# Patient Record
Sex: Male | Born: 1980 | Race: White | Hispanic: No | Marital: Married | Smoking: Never smoker
Health system: Southern US, Community
[De-identification: ages and names within clinical notes are randomized; demographics above are authoritative.]

## PROBLEM LIST (undated history)

## (undated) DIAGNOSIS — K219 Gastro-esophageal reflux disease without esophagitis: Secondary | ICD-10-CM

## (undated) HISTORY — PX: HERNIA REPAIR: SHX51

## (undated) HISTORY — PX: OTHER SURGICAL HISTORY: SHX169

---

## 2005-11-07 ENCOUNTER — Emergency Department: Payer: Self-pay | Admitting: Emergency Medicine

## 2006-01-01 ENCOUNTER — Emergency Department: Payer: Self-pay | Admitting: Emergency Medicine

## 2006-01-01 ENCOUNTER — Other Ambulatory Visit: Payer: Self-pay

## 2009-06-06 ENCOUNTER — Emergency Department: Payer: Self-pay | Admitting: Emergency Medicine

## 2009-07-15 ENCOUNTER — Emergency Department: Payer: Self-pay | Admitting: Emergency Medicine

## 2009-08-12 ENCOUNTER — Emergency Department: Payer: Self-pay | Admitting: Emergency Medicine

## 2010-10-16 ENCOUNTER — Ambulatory Visit: Payer: Self-pay | Admitting: Family Medicine

## 2012-07-06 ENCOUNTER — Ambulatory Visit: Payer: Self-pay | Admitting: Family Medicine

## 2012-07-06 LAB — URINALYSIS, COMPLETE
Bilirubin,UR: NEGATIVE
Leukocyte Esterase: NEGATIVE
Nitrite: NEGATIVE
Protein: NEGATIVE
Specific Gravity: >=1.03 (ref 1.003–1.030)

## 2012-07-08 LAB — URINE CULTURE

## 2013-03-28 ENCOUNTER — Emergency Department: Payer: Self-pay | Admitting: Emergency Medicine

## 2014-02-15 ENCOUNTER — Emergency Department: Payer: Self-pay | Admitting: Emergency Medicine

## 2014-10-21 IMAGING — CT CT LUMBAR SPINE WITHOUT CONTRAST
3 of 9 series · 14 of 33 positions shown, 17 images · non-contrast
Comparison: None.

CLINICAL DATA: Back pain, sudden onset.  Initial encounter.

EXAM:
CT LUMBAR SPINE WITHOUT CONTRAST
TECHNIQUE: Multidetector CT imaging of the lumbar spine was performed without
intravenous contrast administration. Multiplanar CT image
reconstructions were also generated.

[Series 3: l spine soft · axial · 0.41mm/px · z∈[-428,-242]mm · 6 of 131 slices shown, 8 images]
[im 19/131  soft-tissue]
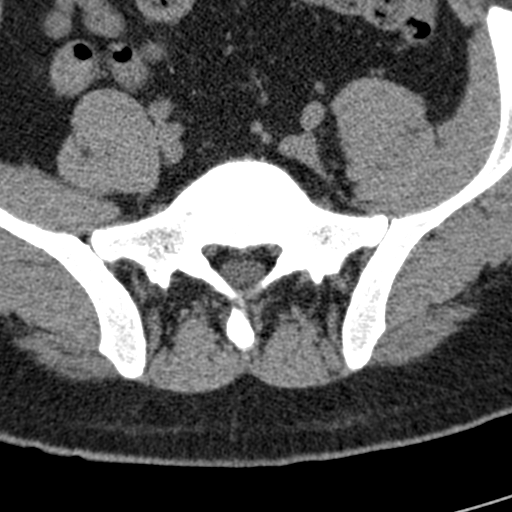
[im 19/131  bone]
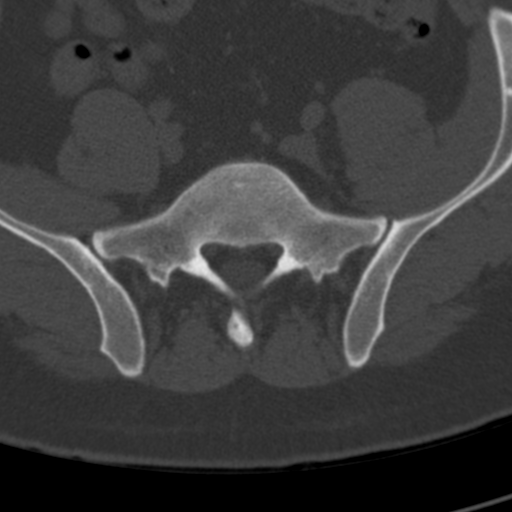
[im 38/131  bone]
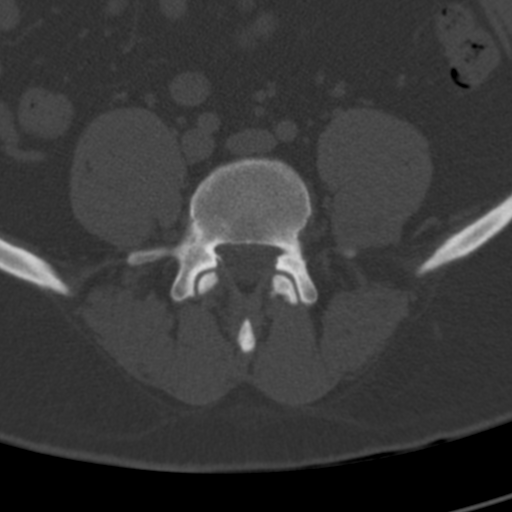
[im 56/131  bone]
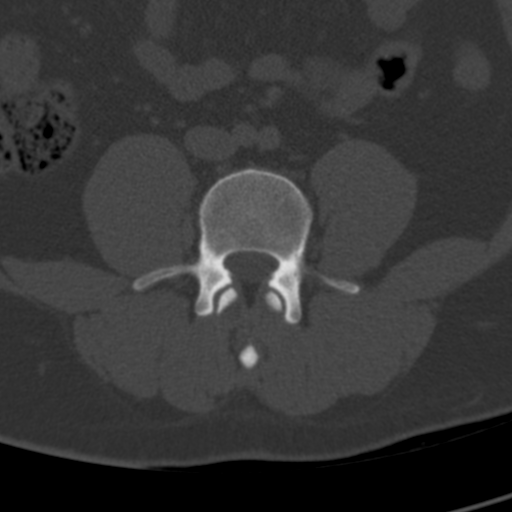
[im 75/131  bone]
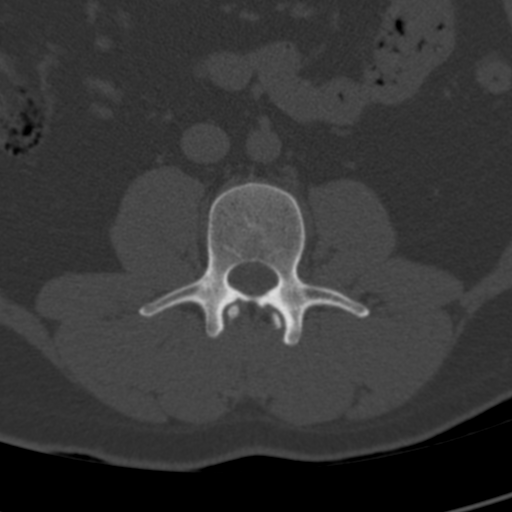
[im 93/131  soft-tissue]
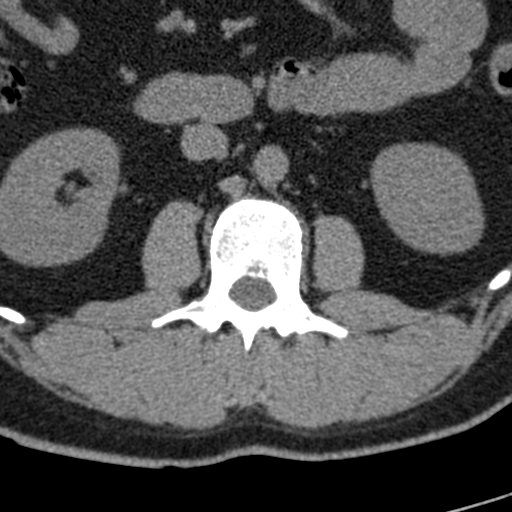
[im 93/131  bone]
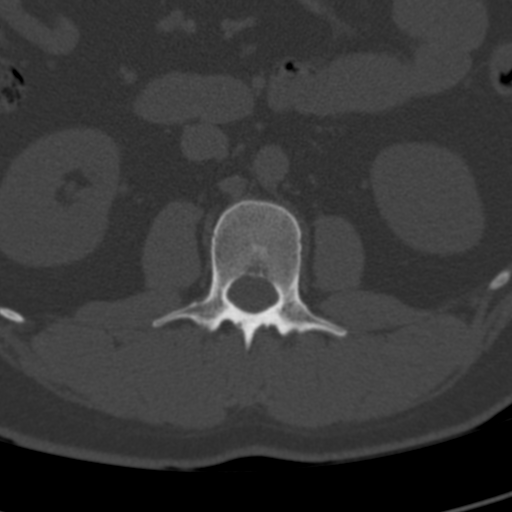
[im 112/131  bone]
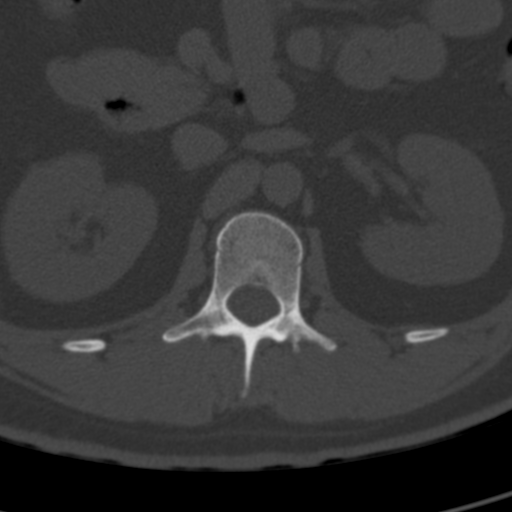

[Series 5: sagittal bone · sagittal · 0.38mm/px · 5 of 67 slices shown, 6 images]
[im 23/67  bone]
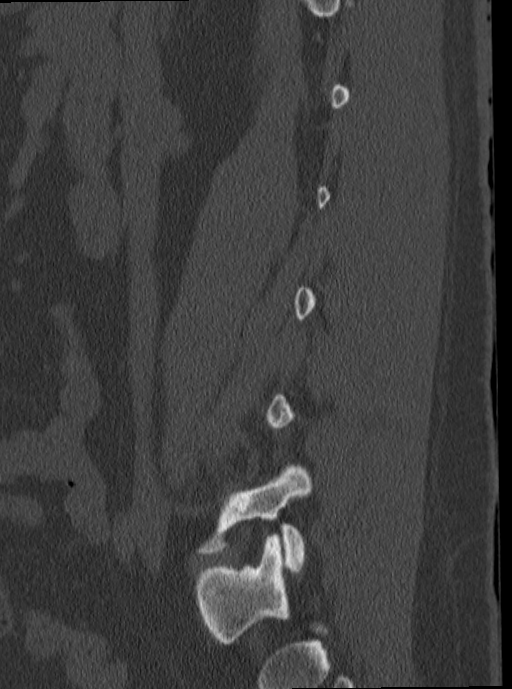
[im 28/67  bone]
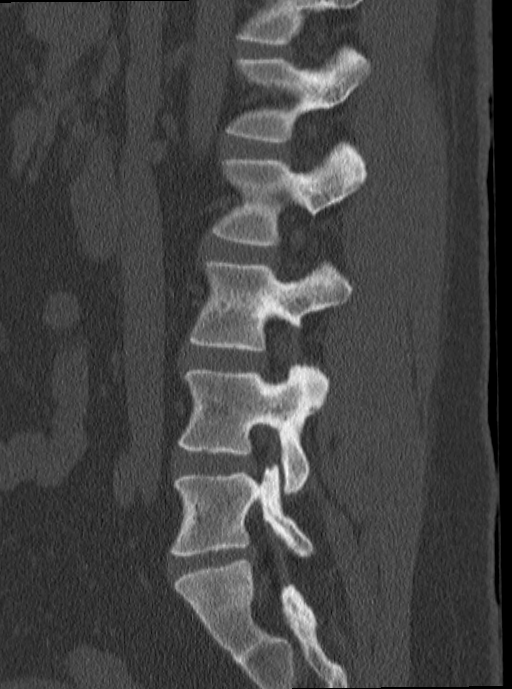
[im 34/67  soft-tissue]
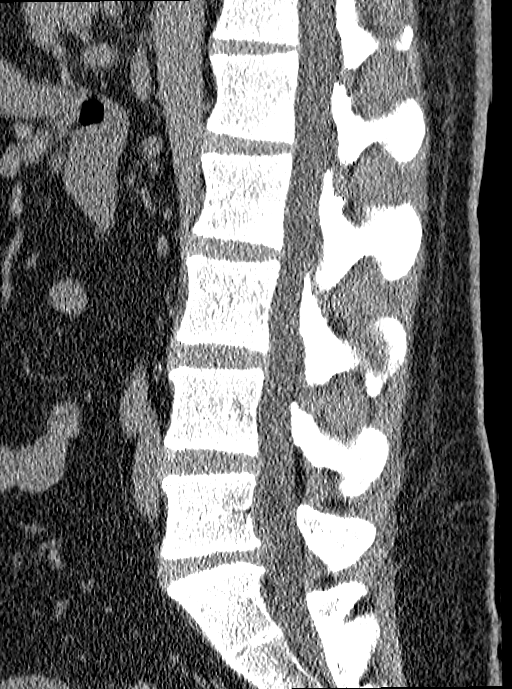
[im 34/67  bone]
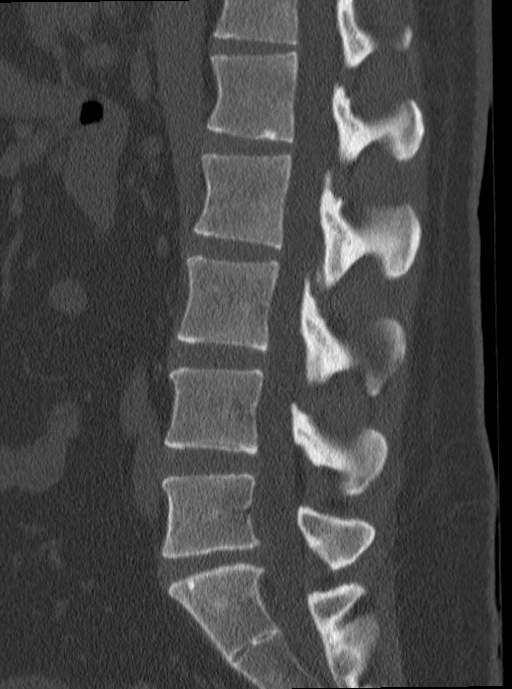
[im 39/67  bone]
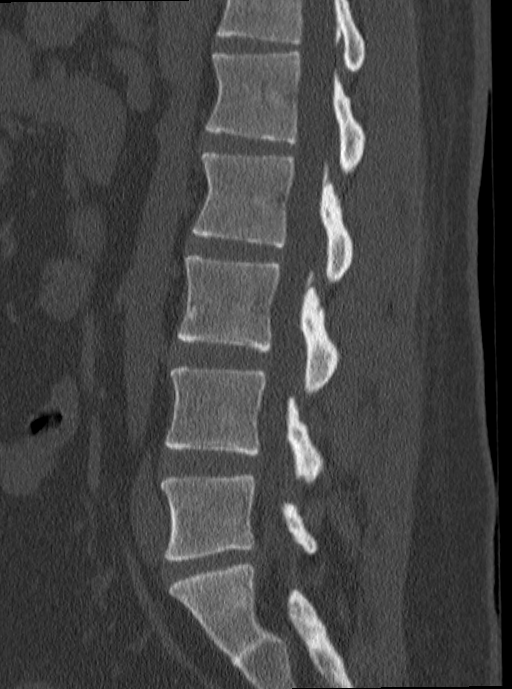
[im 45/67  bone]
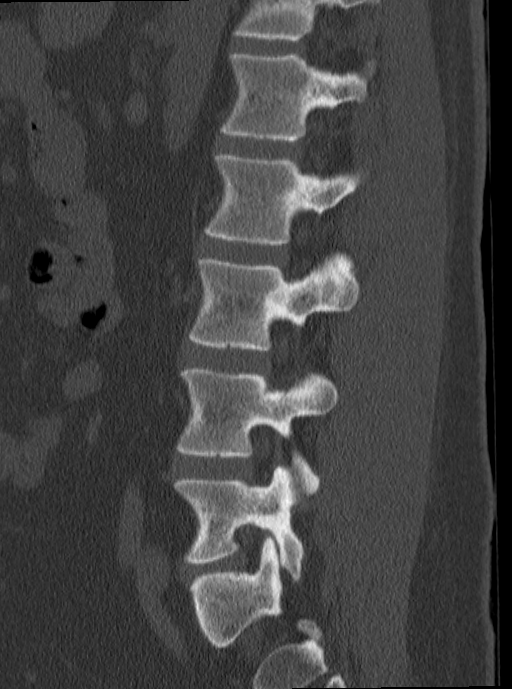

[Series 6: coronal bone · coronal · 0.37mm/px · 3 of 59 slices shown]
[im 12/59  bone]
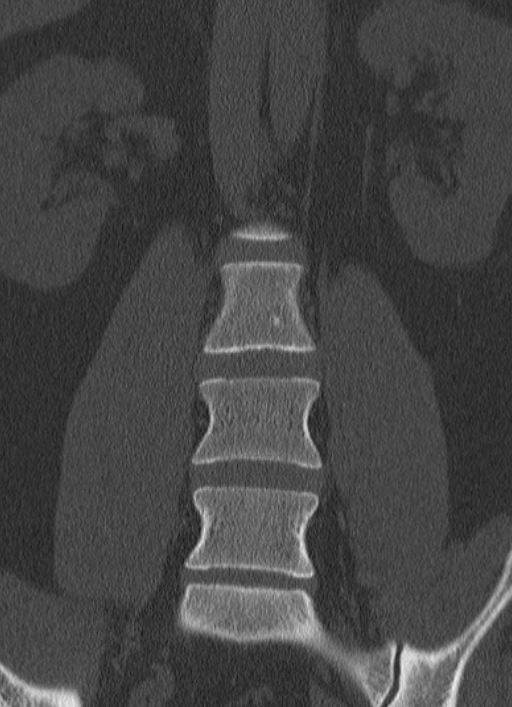
[im 24/59  bone]
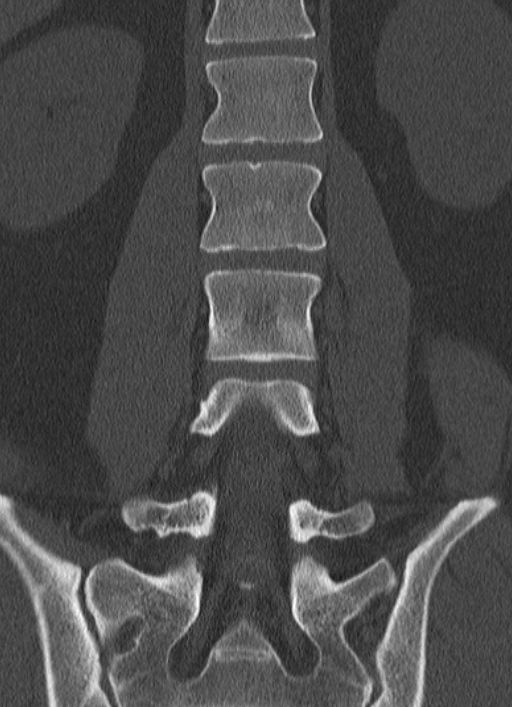
[im 35/59  bone]
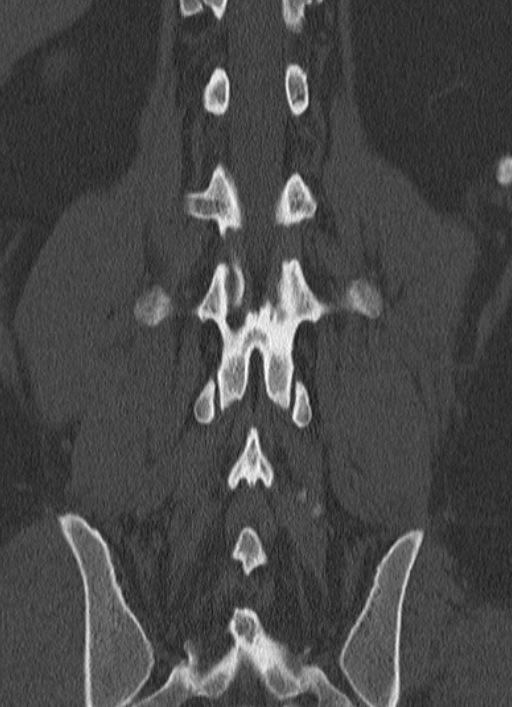

[14 of 33 positions shown; findings below may reference images not displayed]

FINDINGS: There is no acute findings such as fracture, subluxation, or bony
erosion. The lumbar spine is straight, which may be positional.

There is mild disc narrowing at L5-S1, with circumferential disc
bulging and small to moderate central disc herniation. There is no
evidence for high-grade canal or foraminal stenosis.
IMPRESSION: 1. No acute osseous findings.
2. Mild L5-S1 degenerative disc disease with central disc
herniation. No significant canal or foraminal stenosis.

## 2019-02-15 ENCOUNTER — Other Ambulatory Visit: Payer: Self-pay

## 2019-02-15 ENCOUNTER — Ambulatory Visit
Admission: EM | Admit: 2019-02-15 | Discharge: 2019-02-15 | Disposition: A | Discharge status: Home / Self Care | Payer: Self-pay | Attending: Family Medicine | Admitting: Family Medicine

## 2019-02-15 DIAGNOSIS — H1089 Other conjunctivitis: Secondary | ICD-10-CM

## 2019-02-15 MED ORDER — MOXIFLOXACIN HCL 0.5 % OP SOLN: 1.0000 [drp] | Freq: Three times a day (TID) | OPHTHALMIC | 0 refills | Status: AC

## 2019-02-15 NOTE — ED Provider Notes (Signed)
[facility]    CSN: [id number] Arrival date & time: 02/15/19  Francis Sanchez      History   Chief Complaint Chief Complaint  Patient presents with  . Eye Pain    left    HPI Francis Sanchez is a 38 y.o. male.   38 yo male with a c/o left eye irritation, redness, and drainage for the past 6 days. Denies any injuries or foreign body sensation. Does not use contact lenses.    Eye Pain    History reviewed. No pertinent past medical history.  There are no active problems to display for this patient.   Past Surgical History:  Procedure Laterality Date  . HERNIA REPAIR         Home Medications    Prior to Admission medications   Medication Sig Start Date End Date Taking? Authorizing Provider  moxifloxacin (VIGAMOX) 0.5 % ophthalmic solution Place 1 drop into the left eye 3 (three) times daily for 5 days. 02/15/19 02/20/19  Norval Gable, MD    Family History Family History  Problem Relation Age of Onset  . Hypertension Mother   . Hyperlipidemia Mother   . Allergic rhinitis Mother   . Hypertension Father   . Hyperlipidemia Father   . CVA Father   . Diabetes Father   . Heart attack Father     Social History Social History   Tobacco Use  . Smoking status: Never Smoker  . Smokeless tobacco: Never Used  Substance Use Topics  . Alcohol use: Never    Frequency: Never  . Drug use: Yes    Types: Marijuana     Allergies   Patient has no known allergies.   Review of Systems Review of Systems  Eyes: Positive for pain.     Physical Exam Triage Vital Signs ED Triage Vitals  Enc Vitals Group     BP 02/15/19 1724 (!) 160/96     Pulse Rate 02/15/19 1724 86     Resp 02/15/19 1724 17     Temp 02/15/19 1724 98.5 F (36.9 C)     Temp Source 02/15/19 1724 Oral     SpO2 02/15/19 1724 99 %     Weight 02/15/19 1721 240 lb (108.9 kg)     Height 02/15/19 1721 5\' 11"  (1.803 m)     Head Circumference --      Peak Flow --      Pain Score 02/15/19  1721 3     Pain Loc --      Pain Edu? --      Excl. in [place]? --    No data found.  Updated Vital Signs BP (!) 160/96 (BP Location: Left Arm)   Pulse 86   Temp 98.5 F (36.9 C) (Oral)   Resp 17   Ht 5\' 11"  (1.803 m)   Wt 108.9 kg   SpO2 99%   BMI 33.47 kg/m   Visual Acuity Right Eye Distance: 20/25 Left Eye Distance: 20/15 Bilateral Distance: 20/13(corrected)  Right Eye Near:   Left Eye Near:    Bilateral Near:     Physical Exam Vitals signs and nursing note reviewed.  Constitutional:      General: He is not in acute distress.    Appearance: He is not toxic-appearing or diaphoretic.  Eyes:     General: Lids are everted, no foreign bodies appreciated.        Left eye: Discharge present.    Extraocular Movements: Extraocular movements intact.  Conjunctiva/sclera:     Left eye: Left conjunctiva is injected.     Pupils: Pupils are equal, round, and reactive to light.  Neurological:     Mental Status: He is alert.      UC Treatments / Results  Labs (all labs ordered are listed, but only abnormal results are displayed) Labs Reviewed - No data to display  EKG   Radiology No results found.  Procedures Procedures (including critical care time)  Medications Ordered in UC Medications - No data to display  Initial Impression / Assessment and Plan / UC Course  I have reviewed the triage vital signs and the nursing notes.  Pertinent labs & imaging results that were available during my care of the patient were reviewed by me and considered in my medical decision making (see chart for details).      Final Clinical Impressions(s) / UC Diagnoses   Final diagnoses:  Other conjunctivitis of left eye    ED Prescriptions    Medication Sig Dispense Auth. Provider   moxifloxacin (VIGAMOX) 0.5 % ophthalmic solution Place 1 drop into the left eye 3 (three) times daily for 5 days. 3 mL Payton Mccallum, MD      1. diagnosis reviewed with patient 2. rx as per  orders above; reviewed possible side effects, interactions, risks and benefits  3. Follow-up prn if symptoms worsen or don't improve   PDMP not reviewed this encounter.   Payton Mccallum, MD 02/15/19 1935

## 2019-02-15 NOTE — ED Triage Notes (Signed)
Patient complains of left eye irritation x 1 week that is worse in the morning.

## 2019-05-16 ENCOUNTER — Emergency Department: Payer: 59

## 2019-05-16 ENCOUNTER — Other Ambulatory Visit: Payer: Self-pay

## 2019-05-16 ENCOUNTER — Emergency Department
Admission: EM | Admit: 2019-05-16 | Discharge: 2019-05-16 | Disposition: A | Discharge status: Home / Self Care | Payer: 59 | Attending: Emergency Medicine | Admitting: Emergency Medicine

## 2019-05-16 ENCOUNTER — Encounter: Payer: Self-pay | Admitting: Emergency Medicine

## 2019-05-16 DIAGNOSIS — U071 COVID-19: Secondary | ICD-10-CM | POA: Insufficient documentation

## 2019-05-16 DIAGNOSIS — B349 Viral infection, unspecified: Secondary | ICD-10-CM

## 2019-05-16 DIAGNOSIS — R05 Cough: Secondary | ICD-10-CM | POA: Diagnosis present

## 2019-05-16 LAB — SARS CORONAVIRUS 2 (TAT 6-24 HRS): SARS Coronavirus 2: POSITIVE — AB

## 2019-05-16 NOTE — ED Triage Notes (Signed)
Pt states his cough is non productive but he feels like he could cough something up. Pt also reports chills and sweats and body aches.

## 2019-05-16 NOTE — Discharge Instructions (Addendum)
Follow-up with your primary care provider if any continued problems.  Return to the emergency department if any severe worsening of your symptoms such as shortness of breath or difficulty breathing.  The test results for your Covid test will show up on my chart and also if it is positive you will get a phone call.  You should plan on quarantining until your test results has been seen.  You will need an additional 10 days if your Covid test is positive.  Increase fluids.  Tylenol if needed for body aches or fever.  To have the rest of your family checked for Covid you can make an appointment with the free testing at [phone]

## 2019-05-16 NOTE — ED Provider Notes (Signed)
[facility] Emergency Department Provider Note  ____________________________________________   First MD Initiated Contact with Patient 05/16/19 [phone]     (approximate)  I have reviewed the triage vital signs and the nursing notes.   HISTORY  Chief Complaint Chest Pain, Weakness, Fever, and Generalized Body Aches    HPI Francis Sanchez is a 39 y.o. male presents to the ED with a nonproductive cough that began several days ago.  He states that Saturday night he also began having fever, chills and body aches.  He states his appetite has decreased but he is not aware of any change in taste or smell.  He denies any known exposure to Covid.        History reviewed. No pertinent past medical history.  There are no problems to display for this patient.   Past Surgical History:  Procedure Laterality Date  . HERNIA REPAIR      Prior to Admission medications   Not on File    Allergies Shellfish allergy and Bee venom  Family History  Problem Relation Age of Onset  . Hypertension Mother   . Hyperlipidemia Mother   . Allergic rhinitis Mother   . Hypertension Father   . Hyperlipidemia Father   . CVA Father   . Diabetes Father   . Heart attack Father     Social History Social History   Tobacco Use  . Smoking status: Never Smoker  . Smokeless tobacco: Never Used  Substance Use Topics  . Alcohol use: Never  . Drug use: Yes    Types: Marijuana    Review of Systems Constitutional: Subjective fever/chills Eyes: No visual changes. ENT: No sore throat. Cardiovascular: Denies chest pain.  Positive nonproductive cough. Respiratory: Denies shortness of breath. Gastrointestinal: No abdominal pain.  No nausea, no vomiting.  No diarrhea.   Genitourinary: Negative for dysuria. Musculoskeletal: Does not for body aches. Skin: Negative for rash. Neurological: Negative for headaches, focal weakness or  numbness.  ____________________________________________   PHYSICAL EXAM:  VITAL SIGNS: ED Triage Vitals  Enc Vitals Group     BP 05/16/19 0739 131/90     Pulse Rate 05/16/19 0739 96     Resp 05/16/19 0739 18     Temp 05/16/19 0739 98.3 F (36.8 C)     Temp Source 05/16/19 0739 Oral     SpO2 05/16/19 0739 98 %     Weight 05/16/19 0734 260 lb (117.9 kg)     Height 05/16/19 0734 5\' 11"  (1.803 m)     Head Circumference --      Peak Flow --      Pain Score 05/16/19 0734 5     Pain Loc --      Pain Edu? --      Excl. in [place]? --    Constitutional: Alert and oriented. Well appearing and in no acute distress. Eyes: Conjunctivae are normal.  Head: Atraumatic. Nose: No congestion/rhinnorhea. Neck: No stridor.   Cardiovascular: Normal rate, regular rhythm. Grossly normal heart sounds.  Good peripheral circulation. Respiratory: Normal respiratory effort.  No retractions. Lungs CTAB. Musculoskeletal: Moves upper and lower extremities no difficulty.  Normal gait was noted. Neurologic:  Normal speech and language. No gross focal neurologic deficits are appreciated. No gait instability. Skin:  Skin is warm, dry and intact. No rash noted. Psychiatric: Mood and affect are normal. Speech and behavior are normal.  ____________________________________________   LABS (all labs ordered are listed, but only abnormal results are displayed)  Labs  Reviewed  SARS CORONAVIRUS 2 (TAT 6-24 HRS)    RADIOLOGY  Official radiology report(s): DG Chest 2 View  Result Date: 05/16/2019 CLINICAL DATA:  Cough and chest pain EXAM: CHEST - 2 VIEW COMPARISON:  June 06, 2009 FINDINGS: Lungs are clear. Heart size and pulmonary vascularity are normal. No adenopathy. No pneumothorax. No bone lesions. IMPRESSION: Lungs clear.  No evident adenopathy.  Cardiac silhouette normal. Electronically Signed   By: Lowella Grip III M.D.   On: 05/16/2019 07:55     ____________________________________________   PROCEDURES  Procedure(s) performed (including Critical Care):  Procedures   ____________________________________________   INITIAL IMPRESSION / ASSESSMENT AND PLAN / ED COURSE  As part of my medical decision making, I reviewed the following data within the electronic MEDICAL RECORD NUMBER Notes from prior ED visits and [place] Controlled Substance Database  Francis Sanchez was evaluated in Emergency Department on 05/16/2019 for the symptoms described in the history of present illness. He was evaluated in the context of the global COVID-19 pandemic, which necessitated consideration that the patient might be at risk for infection with the SARS-CoV-2 virus that causes COVID-19. Institutional protocols and algorithms that pertain to the evaluation of patients at risk for COVID-19 are in a state of rapid change based on information released by regulatory bodies including the CDC and federal and state organizations. These policies and algorithms were followed during the patient's care in the ED.  39 year old male presents to the ED with complaint of URI symptoms with sudden onset that began proximately 3 days ago.  A Covid test was ordered and patient was made aware that this test will be available in 24 hours.  Chest x-ray was reported as negative.  Patient was discharged in stable condition and afebrile with an O2 sat of 98%.  ____________________________________________   FINAL CLINICAL IMPRESSION(S) / ED DIAGNOSES  Final diagnoses:  Viral illness     ED Discharge Orders    None       Note:  This document was prepared using Dragon voice recognition software and may include unintentional dictation errors.    Johnn Hai, PA-C 05/16/19 1502    Blake Divine, MD 05/17/19 1550

## 2019-05-16 NOTE — ED Triage Notes (Signed)
Pt reports chest tightness since Saturday, fever, cough and weakness.

## 2019-05-17 ENCOUNTER — Telehealth: Payer: Self-pay | Admitting: Nurse Practitioner

## 2019-05-17 ENCOUNTER — Telehealth: Payer: Self-pay

## 2019-05-17 NOTE — Telephone Encounter (Signed)
Contacted patient for positive results of SARS Coronavirus 2. Reviewed with patient to follow discharge instruction including quarantine.Patient verbalized understanding.

## 2019-05-17 NOTE — Telephone Encounter (Signed)
Called to Discuss with patient about Covid symptoms and the use of bamlanivimab, a monoclonal antibody infusion for those with mild to moderate Covid symptoms and at a high risk of hospitalization.     Pt is qualified for this infusion at the [facility] due to co-morbid conditions and/or a member of an at-risk group.     Unable to reach pt  

## 2019-09-25 ENCOUNTER — Emergency Department
Admission: EM | Admit: 2019-09-25 | Discharge: 2019-09-25 | Disposition: A | Discharge status: Home / Self Care | Payer: 59 | Attending: Student | Admitting: Student

## 2019-09-25 ENCOUNTER — Emergency Department: Payer: 59

## 2019-09-25 ENCOUNTER — Other Ambulatory Visit: Payer: Self-pay

## 2019-09-25 DIAGNOSIS — R002 Palpitations: Secondary | ICD-10-CM

## 2019-09-25 DIAGNOSIS — R1011 Right upper quadrant pain: Secondary | ICD-10-CM | POA: Diagnosis not present

## 2019-09-25 LAB — CBC
HCT: 46.8 % (ref 39.0–52.0)
Hemoglobin: 15.2 g/dL (ref 13.0–17.0)
MCH: 29 pg (ref 26.0–34.0)
MCHC: 32.5 g/dL (ref 30.0–36.0)
MCV: 89.3 fL (ref 80.0–100.0)
Platelets: 376 10*3/uL (ref 150–400)
RBC: 5.24 MIL/uL (ref 4.22–5.81)
RDW: 13 % (ref 11.5–15.5)
WBC: 7.5 10*3/uL (ref 4.0–10.5)
nRBC: 0 % (ref 0.0–0.2)

## 2019-09-25 LAB — BASIC METABOLIC PANEL
Anion gap: 8 (ref 5–15)
BUN: 22 mg/dL — ABNORMAL HIGH (ref 6–20)
CO2: 26 mmol/L (ref 22–32)
Calcium: 8.8 mg/dL — ABNORMAL LOW (ref 8.9–10.3)
Chloride: 103 mmol/L (ref 98–111)
Creatinine, Ser: 0.77 mg/dL (ref 0.61–1.24)
GFR calc Af Amer: >60 mL/min (ref >60)
GFR calc non Af Amer: >60 mL/min (ref >60)
Glucose, Bld: 113 mg/dL — ABNORMAL HIGH (ref 70–99)
Potassium: 4.5 mmol/L (ref 3.5–5.1)
Sodium: 137 mmol/L (ref 135–145)

## 2019-09-25 LAB — HEPATIC FUNCTION PANEL
ALT: 30 U/L (ref 0–44)
AST: 20 U/L (ref 15–41)
Albumin: 4.4 g/dL (ref 3.5–5.0)
Alkaline Phosphatase: 58 U/L (ref 38–126)
Bilirubin, Direct: <0.1 mg/dL (ref 0.0–0.2)
Total Bilirubin: 0.5 mg/dL (ref 0.3–1.2)
Total Protein: 8 g/dL (ref 6.5–8.1)

## 2019-09-25 LAB — LIPASE, BLOOD: Lipase: 45 U/L (ref 11–51)

## 2019-09-25 LAB — TROPONIN I (HIGH SENSITIVITY)
Troponin I (High Sensitivity): 3 ng/L (ref <18)
Troponin I (High Sensitivity): 3 ng/L (ref <18)

## 2019-09-25 MED ORDER — SODIUM CHLORIDE 0.9% FLUSH: 3.0000 mL | Freq: Once | INTRAVENOUS | Status: DC

## 2019-09-25 MED ORDER — OMEPRAZOLE 20 MG PO CPDR: 20.0000 mg | DELAYED_RELEASE_CAPSULE | Freq: Every day | ORAL | 0 refills | Status: DC

## 2019-09-25 NOTE — ED Notes (Signed)
Pt c/o fluttering in his chest intermittently x 1 yr, worse today. Pt also c/o R sided abd pain that radiates down his side. Pt states has appt with PCP in June for gallbladder eval. Pt A&O x4, ambulatory with steady gait from lobby to room. NSR on the monitor. Lights dimmed for comfort, given remote to TV by this RN.

## 2019-09-25 NOTE — ED Notes (Signed)
NAD noted at time of D/C. Pt denies questions or concerns. Pt ambulatory to the lobby at this time.  

## 2019-09-25 NOTE — ED Notes (Signed)
US at bedside at this time 

## 2019-09-25 NOTE — ED Provider Notes (Signed)
[facility] Emergency Department Provider Note  ____________________________________________   First MD Initiated Contact with Patient 09/25/19 1159     (approximate)  I have reviewed the triage vital signs and the nursing notes.  History  Chief Complaint Chest Pain    HPI Francis Sanchez is a 39 y.o. male who presents to the emergency department for two separate complaints.  First being heart palpitations/skipping.  Second being intermittent RUQ abdominal pain and difficulty tolerating PO.  Patient states over the last year he has had intermittent sensation of fluttering in his chest, like his heart is skipping a beat.  Located to the lower sternum area.  He describes the sensation as feeling as if something is being deflated in that area.  He denies any discrete chest pain with this.  No known history of arrhythmias or cardiac disease.  States these episodes happen in small spurts, lasting a few seconds and then resolving spontaneously.  Today, however, he has had more of these episodes than normal (4-5 today), which prompted him to seek care.  They seem to come and go at random without any identifiable triggers or inciting events.  Nothing seems to make them better or worse.  He has not been evaluated for this previously.  Second, he complains of right upper quadrant and epigastric discomfort.  Patient says he has had intermittent episodes of the same abdominal pain, which has also been ongoing for years.    He describes this as a fullness, tenderness type sensation.  Primarily located in the RUQ.  5/10 in severity.  He says symptoms are usually set off by eating, and when he has 1 of these episodes the pain usually lasts for several days and he is unable to tolerate by mouth due to vomiting with any kind of eating.  The frequency of the episodes changes, sometimes he has 2 or 3 in a month, sometimes he can go for longer periods of time without feeling symptomatic.  He has never had his gallbladder evaluated for this before.  Does have a family history of cholelithiasis/cholecystectomy.  No fevers, no diarrhea.   Past Medical Hx History reviewed. No pertinent past medical history.  Problem List There are no problems to display for this patient.   Past Surgical Hx Past Surgical History:  Procedure Laterality Date  . HERNIA REPAIR      Medications Prior to Admission medications   Not on File    Allergies Shellfish allergy and Bee venom  Family Hx Family History  Problem Relation Age of Onset  . Hypertension Mother   . Hyperlipidemia Mother   . Allergic rhinitis Mother   . Hypertension Father   . Hyperlipidemia Father   . CVA Father   . Diabetes Father   . Heart attack Father     Social Hx Social History   Tobacco Use  . Smoking status: Never Smoker  . Smokeless tobacco: Never Used  Substance Use Topics  . Alcohol use: Never  . Drug use: Yes    Types: Marijuana     Review of Systems  Constitutional: Negative for fever. Negative for chills. Eyes: Negative for visual changes. ENT: Negative for sore throat. Cardiovascular: Negative for chest pain.  Positive for palpitations. Respiratory: Negative for shortness of breath. Gastrointestinal: Positive for abdominal pain. Genitourinary: Negative for dysuria. Musculoskeletal: Negative for leg swelling. Skin: Negative for rash. Neurological: Negative for headaches.   Physical Exam  Vital Signs: ED Triage Vitals  Enc Vitals Group  BP 09/25/19 0830 (!) 157/88     Pulse Rate 09/25/19 0830 75     Resp 09/25/19 0830 18     Temp 09/25/19 0830 98.3 F (36.8 C)     Temp Source 09/25/19 0830 Oral     SpO2 09/25/19 0830 97 %     Weight 09/25/19 0834 240 lb (108.9 kg)     Height 09/25/19 0834 5\' 11"  (1.803 m)     Head Circumference --      Peak Flow --      Pain Score 09/25/19 0833 5     Pain Loc --      Pain Edu? --      Excl. in [place]? --     Constitutional: Alert  and oriented. Well appearing. NAD.  Head: Normocephalic. Atraumatic. Eyes: Conjunctivae clear. Sclera anicteric. Pupils equal and symmetric. Nose: No masses or lesions. No congestion or rhinorrhea. Mouth/Throat: Wearing mask.  Neck: No stridor. Trachea midline.  Cardiovascular: Normal rate, regular rhythm. Extremities well perfused. Respiratory: Normal respiratory effort.  Lungs CTAB. Gastrointestinal: Soft. Non-distended.  Mild RUQ and epigastric discomfort with palpation, but no rebound, guarding, rigidity.  Remainder of abdomen is soft and nontender. Genitourinary: Deferred. Musculoskeletal: No lower extremity edema. No deformities. Neurologic:  Normal speech and language. No gross focal or lateralizing neurologic deficits are appreciated.  Skin: Skin is warm, dry and intact. No rash noted. Psychiatric: Mood and affect are appropriate for situation.  EKG  Personally reviewed and interpreted by myself.   Date: 09/25/19 Time: 0828 Rate: 74 Rhythm: sinus Axis: normal Intervals: WNL No acute ischemic changes No acute arrhythmias No evidence of Brugada, WPW, or prolonged QTC No STEMI    Radiology  Personally reviewed available imaging myself.   CXR - IMPRESSION:  Negative chest   0829 - IMPRESSION:  Gallbladder is contracted. No sonographic evidence of acute cholecystitis.  Increased liver echogenicity, which probably reflects steatosis.    Procedures  Procedure(s) performed (including critical care):  .1-3 Lead EKG Interpretation Performed by: [place]., MD Authorized by: Miguel Aschoff., MD     Interpretation: normal     ECG rate assessment: normal     Rhythm: sinus rhythm     Ectopy: none     Conduction: normal   Comments:     Indication: Palpitations Impression: NSR, no evidence of acute arrhythmia     Initial Impression / Assessment and Plan / MDM / ED Course  39 y.o. male who presents to the ED for #1 palpitations, #2 intermittent  RUQ/epigastric discomfort  Ddx: palpitations, PVCs, other arrhythmia, electrolyte abnormality, dehydration, GERD Ddx: GERD, pancreatitis, biliary colic, symptomatic cholelithiasis/early cholecystitis  With regards to his complaint of palpitations, EKG reveals NSR without evidence of acute arrhythmia or ischemia.  Initial troponin and delta are both negative.  Electrolytes without actionable derangements.  Normal hemoglobin, hematocrit.  CXR negative.  Will plan for cardiology/PCP referral for further evaluation and potential Holter monitoring.  Patient is agreeable with this.  With regards to his intermittent abdominal discomfort, will obtain hepatic function panel, lipase, RUQ ultrasound.  If ultrasound is unrevealing anticipate outpatient referral to GI and/or general surgery if evidence of non-complicated cholelithiasis.  30 reveals contracted gallbladder, but otherwise unremarkable. HFP normal. As such, given negative work-up, will plan for PO challenge and anticipate discharge with outpatient follow-up.  _______________________________   As part of my medical decision making I have reviewed available labs, radiology tests, reviewed old records/performed chart review.    Final Clinical  Impression(s) / ED Diagnosis  Final diagnoses:  RUQ abdominal pain  Palpitations       Note:  This document was prepared using Dragon voice recognition software and may include unintentional dictation errors.   Miguel Aschoff., MD 09/25/19 [phone]

## 2019-09-25 NOTE — ED Triage Notes (Signed)
Pt c/o heart palpitations/skipping for the past 2 months, also c/o RUQ radiating into the back intermittently , states he has been getting treated recently for GERD.Marland Kitchen denies SOB or other sx.

## 2019-09-25 NOTE — ED Provider Notes (Signed)
-----------------------------------------   2:02 PM on 09/25/2019 -----------------------------------------  Ultrasound negative for acute abnormality.  LFTs and lipase are normal.  We will discharge patient with GI follow-up.  Patient agreeable to plan of care.   Minna Antis, MD 09/25/19 [phone]

## 2019-09-25 NOTE — Discharge Instructions (Addendum)
Thank you for letting us take care of you in the emergency department today.   Please continue to take any regular, prescribed medications.   New medications we have prescribed:  Omeprazole, to help with reflux/gastritis  Please follow up with: Your primary care doctor to review your ER visit and follow up on your symptoms. Discuss with your primary doctor or the cardiologist about a potential Holter monitor GI doctor, to follow up on your abdominal symptoms  Information for cardiology doctor and GI doctor are below.   Please return to the ER for any new or worsening symptoms.

## 2019-09-25 NOTE — ED Notes (Signed)
Pt given PO challenge by this RN per EDP Monks, pt given apple juice per his request. Pt visualized in NAD at this time.

## 2019-10-05 ENCOUNTER — Ambulatory Visit: Payer: 59 | Admitting: Gastroenterology

## 2019-10-17 ENCOUNTER — Other Ambulatory Visit: Payer: Self-pay

## 2019-10-17 ENCOUNTER — Ambulatory Visit (INDEPENDENT_AMBULATORY_CARE_PROVIDER_SITE_OTHER): Payer: 59 | Admitting: Gastroenterology

## 2019-10-17 ENCOUNTER — Encounter: Payer: Self-pay | Admitting: Gastroenterology

## 2019-10-17 VITALS — BP 153/90 | HR 88 | Temp 97.8°F | Wt 258.6 lb

## 2019-10-17 DIAGNOSIS — L719 Rosacea, unspecified: Secondary | ICD-10-CM | POA: Insufficient documentation

## 2019-10-17 DIAGNOSIS — R1310 Dysphagia, unspecified: Secondary | ICD-10-CM

## 2019-10-17 DIAGNOSIS — R110 Nausea: Secondary | ICD-10-CM

## 2019-10-17 NOTE — Addendum Note (Signed)
Addended by: Adela Ports on: 10/17/2019 03:55 PM   Modules accepted: Orders

## 2019-10-17 NOTE — Progress Notes (Signed)
Melodie Bouillon [address]  [address]  [place], [place] [address]  Main: [phone]  Fax: [phone]   Gastroenterology Consultation  Referring Provider:     Dr. Lenard Lance Primary Care Physician:  Patient, No Pcp Per Reason for Consultation:   Nausea vomiting        HPI:    Chief Complaint  Patient presents with  . New Patient (Initial Visit)  . RUQ Abdominal pain    Patient stated that he continues to have RUQ abdominal pain.    CLOVIS MANKINS is a 39 y.o. y/o male referred for consultation & management  by Dr. Patient, No Pcp Per.  Patient reports 10-year history of intermittent nausea and vomiting occurring once or twice a month.  Symptoms are more frequent when he eats food that he has not coughed himself.  Also reports dysphagia to liquids and solids about once a week.  No altered bowel habits.  Reports 1 formed bowel movement daily, with no previous history of blood in stool.  Was recently started on Prilosec for indigestion and this has helped indigestion symptoms.  No history of colon cancer.  No prior EGD or colonoscopy.  History reviewed. No pertinent past medical history.  Past Surgical History:  Procedure Laterality Date  . HERNIA REPAIR      Prior to Admission medications   Medication Sig Start Date End Date Taking? Authorizing Provider  omeprazole (PRILOSEC) 20 MG capsule Take 1 capsule (20 mg total) by mouth daily. 09/25/19 10/25/19 Yes Miguel Aschoff., MD    Family History  Problem Relation Age of Onset  . Hypertension Mother   . Hyperlipidemia Mother   . Allergic rhinitis Mother   . Hypertension Father   . Hyperlipidemia Father   . CVA Father   . Diabetes Father   . Heart attack Father      Social History   Tobacco Use  . Smoking status: Never Smoker  . Smokeless tobacco: Never Used  Vaping Use  . Vaping Use: Never used  Substance Use Topics  . Alcohol use: Never  . Drug use: Yes    Types: Marijuana    Allergies as of  10/17/2019 - Review Complete 10/17/2019  Allergen Reaction Noted  . Shellfish allergy Swelling 05/16/2019  . Bee venom Swelling 08/24/2013  . Other Swelling 08/24/2013    Review of Systems:    All systems reviewed and negative except where noted in HPI.   Physical Exam:  BP (!) 153/90   Pulse 88   Temp 97.8 F (36.6 C) (Oral)   Wt 258 lb 9.6 oz (117.3 kg)   BMI 36.07 kg/m  No LMP for male patient. Psych:  Alert and cooperative. Normal mood and affect. General:   Alert,  Well-developed, well-nourished, pleasant and cooperative in NAD Head:  Normocephalic and atraumatic. Eyes:  Sclera clear, no icterus.   Conjunctiva pink. Ears:  Normal auditory acuity. Nose:  No deformity, discharge, or lesions. Mouth:  No deformity or lesions,oropharynx pink & moist. Neck:  Supple; no masses or thyromegaly. Abdomen:  Normal bowel sounds.  No bruits.  Soft, non-tender and non-distended without masses, hepatosplenomegaly or hernias noted.  No guarding or rebound tenderness.    Msk:  Symmetrical without gross deformities. Good, equal movement & strength bilaterally. Pulses:  Normal pulses noted. Extremities:  No clubbing or edema.  No cyanosis. Neurologic:  Alert and oriented x3;  grossly normal neurologically. Skin:  Intact without significant lesions or rashes. No jaundice. Lymph Nodes:  No significant cervical adenopathy. Psych:  Alert and cooperative. Normal mood and affect.   Labs: CBC    Component Value Date/Time   WBC 7.5 09/25/2019 0835   RBC 5.24 09/25/2019 0835   HGB 15.2 09/25/2019 0835   HCT 46.8 09/25/2019 0835   PLT 376 09/25/2019 0835   MCV 89.3 09/25/2019 0835   MCH 29.0 09/25/2019 0835   MCHC 32.5 09/25/2019 0835   RDW 13.0 09/25/2019 0835   CMP     Component Value Date/Time   NA 137 09/25/2019 0835   K 4.5 09/25/2019 0835   CL 103 09/25/2019 0835   CO2 26 09/25/2019 0835   GLUCOSE 113 (H) 09/25/2019 0835   BUN 22 (H) 09/25/2019 0835   CREATININE 0.77  09/25/2019 0835   CALCIUM 8.8 (L) 09/25/2019 0835   PROT 8.0 09/25/2019 1152   ALBUMIN 4.4 09/25/2019 1152   AST 20 09/25/2019 1152   ALT 30 09/25/2019 1152   ALKPHOS 58 09/25/2019 1152   BILITOT 0.5 09/25/2019 1152   GFRNONAA >60 09/25/2019 0835   GFRAA >60 09/25/2019 0835    Imaging Studies: DG Chest 2 View  Result Date: 09/25/2019 CLINICAL DATA:  Chest pain EXAM: CHEST - 2 VIEW COMPARISON:  05/16/2019 FINDINGS: Normal heart size and mediastinal contours. No acute infiltrate or edema. No effusion or pneumothorax. No acute osseous findings. IMPRESSION: Negative chest Electronically Signed   By: Monte Fantasia M.D.   On: 09/25/2019 08:54   US ABDOMEN LIMITED RUQ  Result Date: 09/25/2019 CLINICAL DATA:  Right upper quadrant pain EXAM: ULTRASOUND ABDOMEN LIMITED RIGHT UPPER QUADRANT COMPARISON:  None. FINDINGS: Gallbladder: Contracted.  No wall thickening.  No sonographic Murphy sign noted. Common bile duct: Diameter: 3 mm, normal Liver: No focal lesion identified. Increased parenchymal echogenicity. Portal vein is patent on color Doppler imaging with normal direction of blood flow towards the liver. Other: None. IMPRESSION: Gallbladder is contracted. No sonographic evidence of acute cholecystitis. Increased liver echogenicity, which probably reflects steatosis. Electronically Signed   By: Macy Mis M.D.   On: 09/25/2019 13:09    Assessment and Plan:   FINNEAN CERAMI is a 39 y.o. y/o male has been referred for nausea vomiting, dysphagia  His intermittent nausea vomiting symptoms may be related to reflux itself .  However, his dysphagia is concerning an EGD for further evaluation  I have asked him to stop the Prilosec 2 weeks before EGD, as that may help reveal etiology of symptoms such as EOE better  I have asked him to eat bite-size foods, moist foods, and avoid dry foods and hard meats in the meantime  I have discussed alternative options, risks & benefits,  which include,  but are not limited to, bleeding, infection, perforation,respiratory complication & drug reaction.  The patient agrees with this plan & written consent will be obtained.    Patient educated extensively on acid reflux lifestyle modification, including buying a bed wedge, not eating 3 hrs before bedtime, diet modifications, and handout given for the same.    Dr Vonda Antigua  Speech recognition software was used to dictate the above note.

## 2019-10-26 ENCOUNTER — Other Ambulatory Visit
Admission: RE | Admit: 2019-10-26 | Discharge: 2019-10-26 | Disposition: A | Discharge status: Home / Self Care | Payer: 59 | Source: Ambulatory Visit | Attending: Gastroenterology | Admitting: Gastroenterology

## 2019-10-26 ENCOUNTER — Other Ambulatory Visit: Payer: Self-pay

## 2019-10-26 DIAGNOSIS — Z20822 Contact with and (suspected) exposure to covid-19: Secondary | ICD-10-CM | POA: Insufficient documentation

## 2019-10-26 DIAGNOSIS — Z01812 Encounter for preprocedural laboratory examination: Secondary | ICD-10-CM | POA: Diagnosis not present

## 2019-10-26 LAB — SARS CORONAVIRUS 2 (TAT 6-24 HRS): SARS Coronavirus 2: NEGATIVE

## 2019-10-30 ENCOUNTER — Ambulatory Visit: Payer: 59 | Admitting: Anesthesiology

## 2019-10-30 ENCOUNTER — Encounter
Admission: RE | Disposition: A | Discharge status: Home / Self Care | Payer: Self-pay | Source: Home / Self Care | Attending: Gastroenterology

## 2019-10-30 ENCOUNTER — Ambulatory Visit
Admission: RE | Admit: 2019-10-30 | Discharge: 2019-10-30 | Disposition: A | Discharge status: Home / Self Care | Payer: 59 | Attending: Gastroenterology | Admitting: Gastroenterology

## 2019-10-30 ENCOUNTER — Encounter: Payer: Self-pay | Admitting: Gastroenterology

## 2019-10-30 DIAGNOSIS — R110 Nausea: Secondary | ICD-10-CM

## 2019-10-30 DIAGNOSIS — K449 Diaphragmatic hernia without obstruction or gangrene: Secondary | ICD-10-CM | POA: Insufficient documentation

## 2019-10-30 DIAGNOSIS — R1310 Dysphagia, unspecified: Secondary | ICD-10-CM

## 2019-10-30 DIAGNOSIS — R112 Nausea with vomiting, unspecified: Secondary | ICD-10-CM | POA: Insufficient documentation

## 2019-10-30 DIAGNOSIS — K317 Polyp of stomach and duodenum: Secondary | ICD-10-CM | POA: Insufficient documentation

## 2019-10-30 DIAGNOSIS — K2100 Gastro-esophageal reflux disease with esophagitis, without bleeding: Secondary | ICD-10-CM | POA: Diagnosis not present

## 2019-10-30 DIAGNOSIS — K209 Esophagitis, unspecified without bleeding: Secondary | ICD-10-CM

## 2019-10-30 DIAGNOSIS — Z79899 Other long term (current) drug therapy: Secondary | ICD-10-CM | POA: Insufficient documentation

## 2019-10-30 HISTORY — DX: Gastro-esophageal reflux disease without esophagitis: K21.9

## 2019-10-30 HISTORY — PX: ESOPHAGOGASTRODUODENOSCOPY (EGD) WITH PROPOFOL: SHX5813

## 2019-10-30 SURGERY — ESOPHAGOGASTRODUODENOSCOPY (EGD) WITH PROPOFOL
Anesthesia: General

## 2019-10-30 MED ORDER — PROPOFOL 500 MG/50ML IV EMUL
INTRAVENOUS | Status: AC
  Filled 2019-10-30: qty 50

## 2019-10-30 MED ORDER — PROPOFOL 10 MG/ML IV BOLUS
INTRAVENOUS | Status: AC
  Filled 2019-10-30: qty 20

## 2019-10-30 MED ORDER — OMEPRAZOLE 20 MG PO CPDR: 20.0000 mg | DELAYED_RELEASE_CAPSULE | Freq: Two times a day (BID) | ORAL | 0 refills | Status: DC

## 2019-10-30 MED ORDER — SODIUM CHLORIDE 0.9 % IV SOLN
INTRAVENOUS | Status: DC
  Administered 2019-10-30: 1000 mL via INTRAVENOUS

## 2019-10-30 MED ORDER — PROPOFOL 10 MG/ML IV BOLUS
INTRAVENOUS | Status: DC | PRN
  Administered 2019-10-30 (×3): 40 mg via INTRAVENOUS
  Administered 2019-10-30: 80 mg via INTRAVENOUS
  Administered 2019-10-30: 40 mg via INTRAVENOUS

## 2019-10-30 NOTE — Op Note (Signed)
[facility] Gastroenterology Patient Name: Francis Sanchez Procedure Date: 10/30/2019 11:31 AM MRN: [id number] Account #: [id number] Date of Birth: 09/14/1980 Admit Type: Outpatient Age: 39 Room: [facility] ENDO ROOM 4 Gender: Male Note Status: Finalized Procedure:             Upper GI endoscopy Indications:           Dysphagia, Nausea with vomiting Providers:             Assia Meanor B. Bonna Gains MD, MD Medicines:             Monitored Anesthesia Care Complications:         No immediate complications. Procedure:             Pre-Anesthesia Assessment:                        - The risks and benefits of the procedure and the                         sedation options and risks were discussed with the                         patient. All questions were answered and informed                         consent was obtained.                        - Patient identification and proposed procedure were                         verified prior to the procedure.                        - ASA Grade Assessment: II - A patient with mild                         systemic disease.                        After obtaining informed consent, the endoscope was                         passed under direct vision. Throughout the procedure,                         the patient's blood pressure, pulse, and oxygen                         saturations were monitored continuously. The Endoscope                         was introduced through the mouth, and advanced to the                         second part of duodenum. The upper GI endoscopy was                         accomplished with ease. The patient tolerated the  procedure well. Findings:      LA Grade A (one or more mucosal breaks less than 5 mm, not extending       between tops of 2 mucosal folds) esophagitis with no bleeding was found       at the gastroesophageal junction.      There is no endoscopic evidence of stricture in the  entire esophagus.       Biopsies were obtained from the proximal and distal esophagus with cold       forceps for histology of suspected eosinophilic esophagitis.      A few 3 to 5 mm sessile polyps with no bleeding and no stigmata of       recent bleeding were found in the gastric body. Biopsies were taken with       a cold forceps for histology.      A small hiatal hernia was present.      The exam of the stomach was otherwise normal.      Patchy mild mucosal changes characterized by discoloration were found in       the second portion of the duodenum. Biopsies were taken with a cold       forceps for histology.      The exam of the duodenum was otherwise normal. Impression:            - LA Grade A reflux esophagitis with no bleeding.                        - A few gastric polyps. Biopsied.                        - Small hiatal hernia.                        - Mucosal changes in the duodenum. Biopsied. Recommendation:        - Await pathology results.                        - Follow an antireflux regimen.                        - Take prescribed proton pump inhibitor or H2 blocker                         (antacid) medications 30 - 60 minutes before meals.                        - Return to my office as previously scheduled.                        - Return to primary care physician in 4 weeks.                        - Continue present medications.                        - The findings and recommendations were discussed with                         the patient.                        -  The findings and recommendations were discussed with                         the patient's family. Procedure Code(s):     --- Professional ---                        [phone], Esophagogastroduodenoscopy, flexible,                         transoral; with biopsy, single or multiple Diagnosis Code(s):     --- Professional ---                        K21.00, Gastro-esophageal reflux disease with                          esophagitis, without bleeding                        K31.7, Polyp of stomach and duodenum                        K31.89, Other diseases of stomach and duodenum                        R13.10, Dysphagia, unspecified                        R11.2, Nausea with vomiting, unspecified CPT copyright 2019 American Medical Association. All rights reserved. The codes documented in this report are preliminary and upon coder review may  be revised to meet current compliance requirements.  Melodie Bouillon, MD Michel Bickers B. Maximino Greenland MD, MD 10/30/2019 12:06:53 PM This report has been signed electronically. Number of Addenda: 0 Note Initiated On: 10/30/2019 11:31 AM Estimated Blood Loss:  Estimated blood loss: none.      [facility]

## 2019-10-30 NOTE — Transfer of Care (Signed)
Immediate Anesthesia Transfer of Care Note  Patient: Francis Sanchez  Procedure(s) Performed: ESOPHAGOGASTRODUODENOSCOPY (EGD) WITH PROPOFOL (N/A )  Patient Location: PACU  Anesthesia Type:General  Level of Consciousness: sedated  Airway & Oxygen Therapy: Patient Spontanous Breathing and Patient connected to nasal cannula oxygen  Post-op Assessment: Report given to RN and Post -op Vital signs reviewed and stable  Post vital signs: Reviewed and stable  Last Vitals:  Vitals Value Taken Time  BP 138/85 10/30/19 1209  Temp 36.2 C 10/30/19 1209  Pulse 77 10/30/19 1211  Resp 11 10/30/19 1211  SpO2 99 % 10/30/19 1211  Vitals shown include unvalidated device data.  Last Pain:  Vitals:   10/30/19 1209  TempSrc: Temporal  PainSc:          Complications: No complications documented.

## 2019-10-30 NOTE — H&P (Signed)
Melodie Bouillon, MD [address], [address], [place], [place], [address] [address], [address], [place], [place], [address] Phone: [phone]  Fax: [phone]  Primary Care Physician:  Patient, No Pcp Per   Pre-Procedure History & Physical: HPI:  Francis Sanchez is a 39 y.o. male is here for an EGD.   Past Medical History:  Diagnosis Date  . GERD (gastroesophageal reflux disease)     Past Surgical History:  Procedure Laterality Date  . HERNIA REPAIR    . [place] teeth extraction      Prior to Admission medications   Medication Sig Start Date End Date Taking? Authorizing Provider  omeprazole (PRILOSEC) 20 MG capsule Take 1 capsule (20 mg total) by mouth daily. 09/25/19 10/25/19  Miguel Aschoff., MD    Allergies as of 10/18/2019 - Review Complete 10/17/2019  Allergen Reaction Noted  . Shellfish allergy Swelling 05/16/2019  . Bee venom Swelling 08/24/2013  . Other Swelling 08/24/2013    Family History  Problem Relation Age of Onset  . Hypertension Mother   . Hyperlipidemia Mother   . Allergic rhinitis Mother   . Hypertension Father   . Hyperlipidemia Father   . CVA Father   . Diabetes Father   . Heart attack Father     Social History   Socioeconomic History  . Marital status: Married    Spouse name: Not on file  . Number of children: Not on file  . Years of education: Not on file  . Highest education level: Not on file  Occupational History  . Not on file  Tobacco Use  . Smoking status: Never Smoker  . Smokeless tobacco: Never Used  Vaping Use  . Vaping Use: Never used  Substance and Sexual Activity  . Alcohol use: Never  . Drug use: Yes    Types: Marijuana  . Sexual activity: Not on file  Other Topics Concern  . Not on file  Social History Narrative  . Not on file   Social Determinants of Health   Financial Resource Strain:   . Difficulty of Paying Living Expenses:   Food Insecurity:   . Worried About Programme researcher, broadcasting/film/video in the Last  Year:   . Barista in the Last Year:   Transportation Needs:   . Freight forwarder (Medical):   Marland Kitchen Lack of Transportation (Non-Medical):   Physical Activity:   . Days of Exercise per Week:   . Minutes of Exercise per Session:   Stress:   . Feeling of Stress :   Social Connections:   . Frequency of Communication with Friends and Family:   . Frequency of Social Gatherings with Friends and Family:   . Attends Religious Services:   . Active Member of Clubs or Organizations:   . Attends Banker Meetings:   Marland Kitchen Marital Status:   Intimate Partner Violence:   . Fear of Current or Ex-Partner:   . Emotionally Abused:   Marland Kitchen Physically Abused:   . Sexually Abused:     Review of Systems: See HPI, otherwise negative ROS  Physical Exam: BP (!) 147/88   Pulse 70   Temp (!) 97 F (36.1 C) (Tympanic)   Resp 16   Ht 5\' 10"  (1.778 m)   Wt 113.9 kg   SpO2 98%   BMI 36.01 kg/m  General:   Alert,  pleasant and cooperative in NAD Head:  Normocephalic and atraumatic. Neck:  Supple; no masses or thyromegaly. Lungs:  Clear  throughout to auscultation, normal respiratory effort.    Heart:  +S1, +S2, Regular rate and rhythm, No edema. Abdomen:  Soft, nontender and nondistended. Normal bowel sounds, without guarding, and without rebound.   Neurologic:  Alert and  oriented x4;  grossly normal neurologically.  Impression/Plan: Francis Sanchez is here for an EGD for dysphagia  Risks, benefits, limitations, and alternatives regarding the procedure have been reviewed with the patient.  Questions have been answered.  All parties agreeable.   Pasty Spillers, MD  10/30/2019, 11:48 AM

## 2019-10-30 NOTE — Anesthesia Preprocedure Evaluation (Signed)
Anesthesia Evaluation  Patient identified by MRN, date of birth, ID band Patient awake    Reviewed: Allergy & Precautions, NPO status , Patient's Chart, lab work & pertinent test results  History of Anesthesia Complications Negative for: history of anesthetic complications  Airway Mallampati: II  TM Distance: >3 FB Neck ROM: Full    Dental no notable dental hx. (+) Teeth Intact   Pulmonary neg pulmonary ROS, neg sleep apnea, neg COPD, Patient abstained from smoking.Not current smoker,    Pulmonary exam normal breath sounds clear to auscultation       Cardiovascular Exercise Tolerance: Good METS(-) hypertension(-) CAD and (-) Past MI negative cardio ROS  (-) dysrhythmias  Rhythm:Regular Rate:Normal - Systolic murmurs    Neuro/Psych negative neurological ROS  negative psych ROS   GI/Hepatic GERD  ,(+)     substance abuse  marijuana use, Smokes MJ 3-6 times per week   Endo/Other  neg diabetes  Renal/GU negative Renal ROS     Musculoskeletal   Abdominal   Peds  Hematology   Anesthesia Other Findings Past Medical History: No date: GERD (gastroesophageal reflux disease)  Reproductive/Obstetrics                             Anesthesia Physical Anesthesia Plan  ASA: II  Anesthesia Plan: General   Post-op Pain Management:    Induction: Intravenous  PONV Risk Score and Plan: 2 and Ondansetron, Propofol infusion and TIVA  Airway Management Planned: Nasal Cannula  Additional Equipment: None  Intra-op Plan:   Post-operative Plan:   Informed Consent: I have reviewed the patients History and Physical, chart, labs and discussed the procedure including the risks, benefits and alternatives for the proposed anesthesia with the patient or authorized representative who has indicated his/her understanding and acceptance.     Dental advisory given  Plan Discussed with: CRNA and  Surgeon  Anesthesia Plan Comments: (Discussed risks of anesthesia with patient, including possibility of difficulty with spontaneous ventilation under anesthesia necessitating airway intervention, PONV, and rare risks such as cardiac or respiratory or neurological events. Patient understands.)        Anesthesia Quick Evaluation

## 2019-10-30 NOTE — Anesthesia Postprocedure Evaluation (Signed)
Anesthesia Post Note  Patient: JAHSIAH CARPENTER  Procedure(s) Performed: ESOPHAGOGASTRODUODENOSCOPY (EGD) WITH PROPOFOL (N/A )  Patient location during evaluation: Endoscopy Anesthesia Type: General Level of consciousness: awake and alert Pain management: pain level controlled Vital Signs Assessment: post-procedure vital signs reviewed and stable Respiratory status: spontaneous breathing, nonlabored ventilation, respiratory function stable and patient connected to nasal cannula oxygen Cardiovascular status: blood pressure returned to baseline and stable Postop Assessment: no apparent nausea or vomiting Anesthetic complications: no   No complications documented.   Last Vitals:  Vitals:   10/30/19 1219 10/30/19 1229  BP: 126/80 (!) 134/98  Pulse: 68 64  Resp: 17 17  Temp:    SpO2: 98% 99%    Last Pain:  Vitals:   10/30/19 1219  TempSrc:   PainSc: 0-No pain                 Corinda Gubler

## 2019-10-31 ENCOUNTER — Encounter: Payer: Self-pay | Admitting: Gastroenterology

## 2019-10-31 LAB — SURGICAL PATHOLOGY

## 2019-11-07 ENCOUNTER — Telehealth: Payer: Self-pay

## 2019-11-07 NOTE — Telephone Encounter (Signed)
Patient called wanting to know what his biopsy results were. He wants to know if he needs a follow up appointment with you. Can you please advise.

## 2019-11-09 ENCOUNTER — Encounter: Payer: Self-pay | Admitting: Gastroenterology

## 2019-11-23 NOTE — Telephone Encounter (Signed)
Letter was sent to patient with his pathology results and Dr. Michele Mcalpine recommendations.

## 2020-01-19 IMAGING — CR DG CHEST 2V
1 series · 2 of 2 positions shown · non-contrast
Comparison: June 06, 2009

CLINICAL DATA: Cough and chest pain

EXAM:
CHEST - 2 VIEW

[Series 1: dg chest 2 view · 0.14mm/px · 2 of 2 slices shown]
[im 1/2]
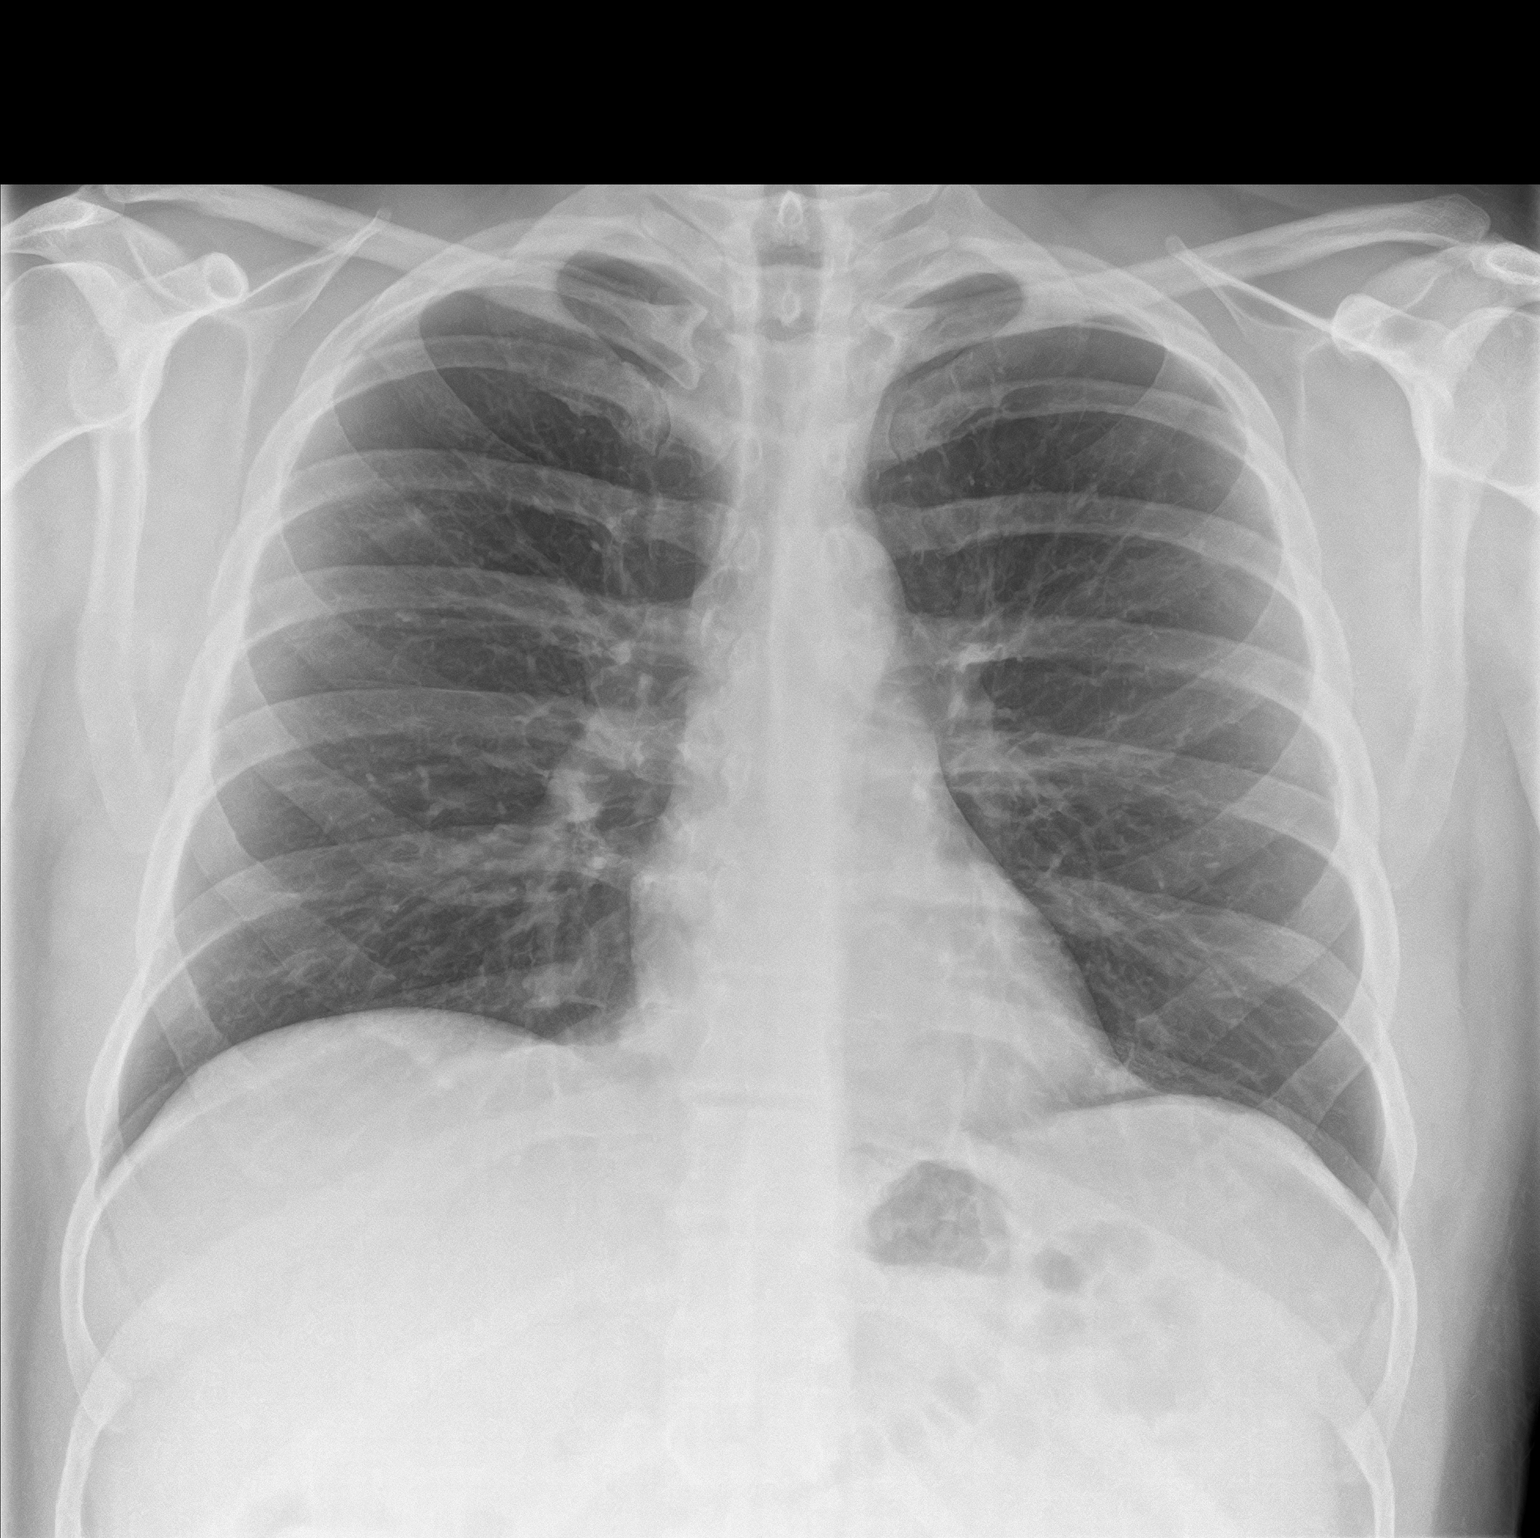
[im 2/2]
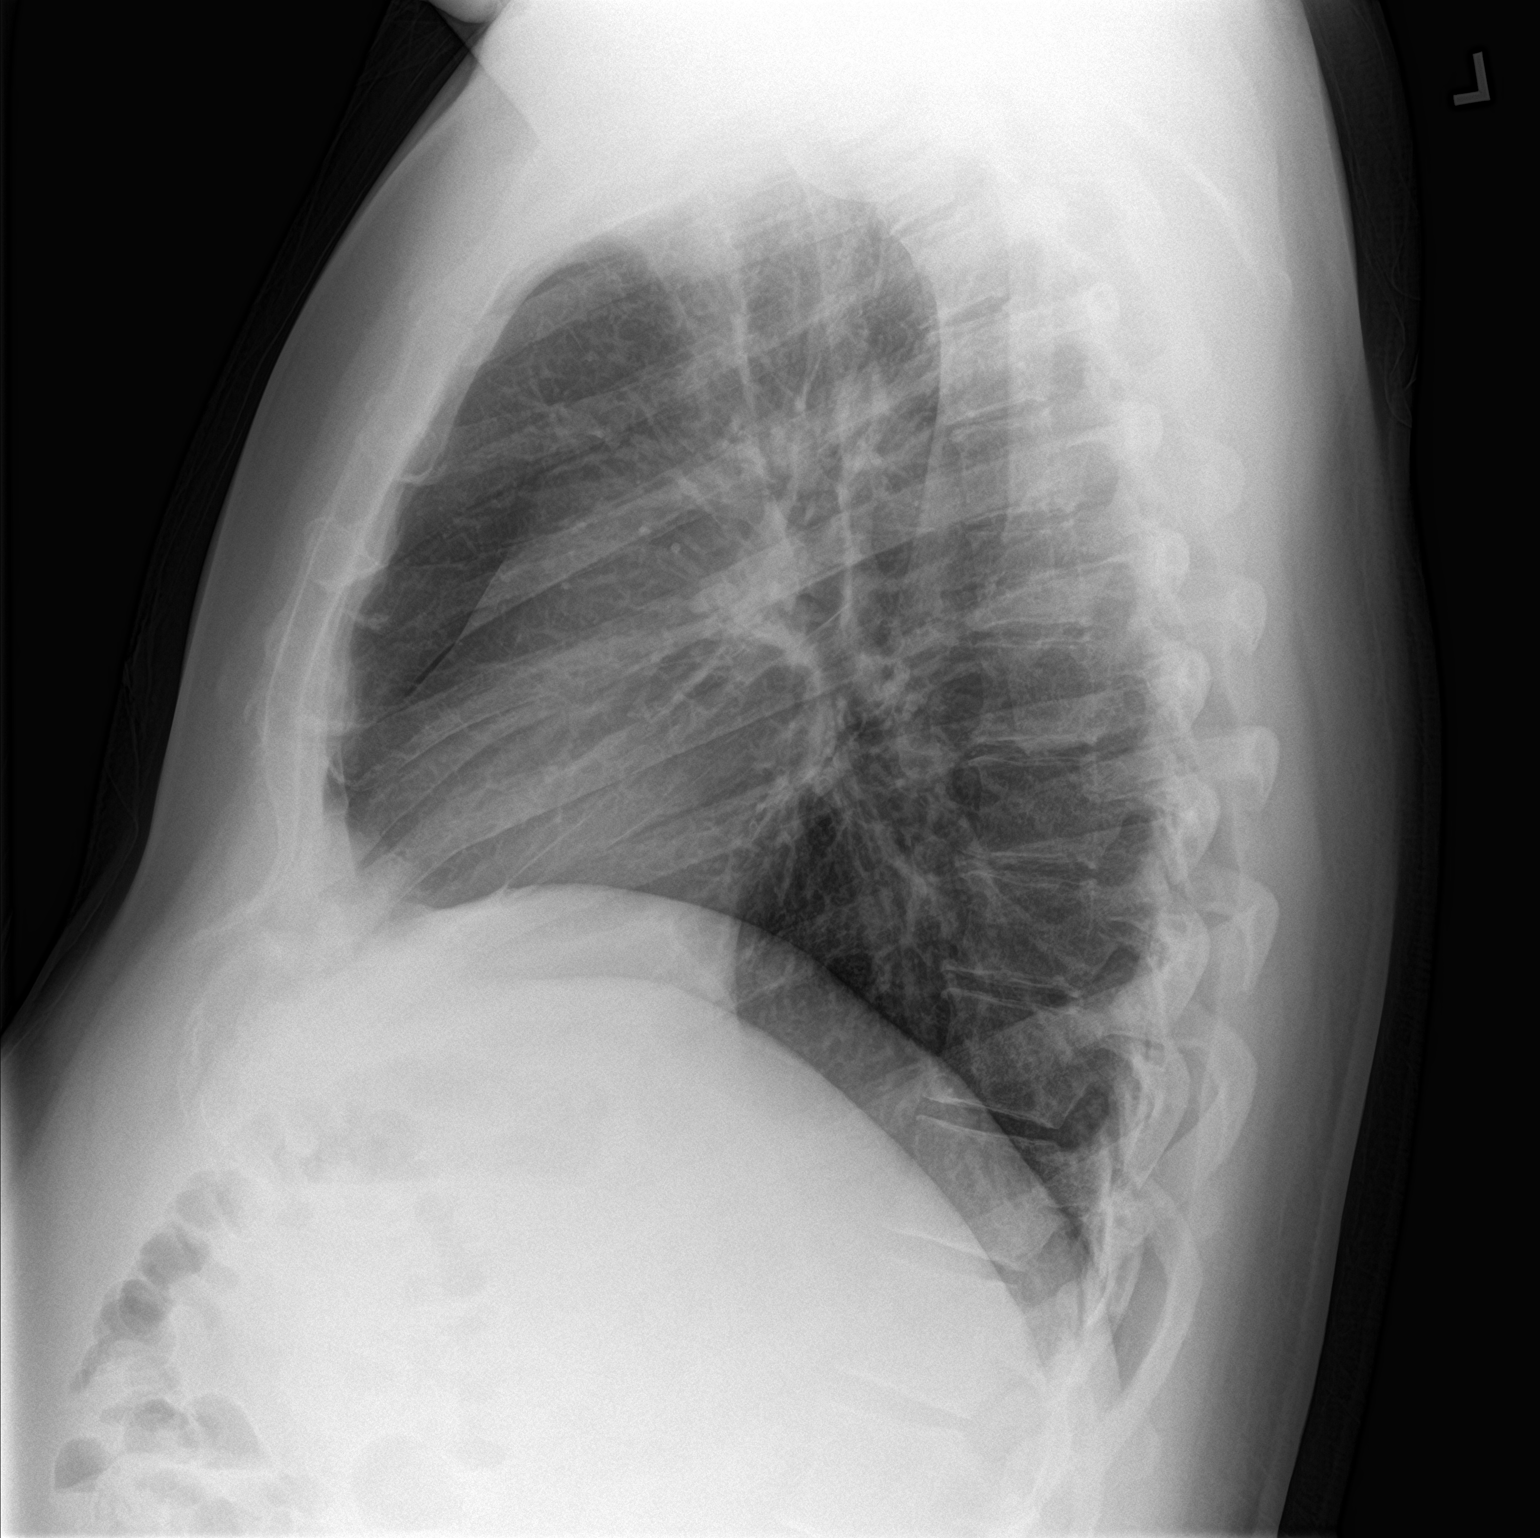

[2 of 2 positions shown; findings below may reference images not displayed]

FINDINGS: Lungs are clear. Heart size and pulmonary vascularity are normal. No
adenopathy. No pneumothorax. No bone lesions.
IMPRESSION: Lungs clear.  No evident adenopathy.  Cardiac silhouette normal.

## 2020-10-15 ENCOUNTER — Emergency Department
Admission: EM | Admit: 2020-10-15 | Discharge: 2020-10-15 | Disposition: A | Discharge status: Home / Self Care | Payer: Self-pay | Attending: Emergency Medicine | Admitting: Emergency Medicine

## 2020-10-15 ENCOUNTER — Other Ambulatory Visit: Payer: Self-pay

## 2020-10-15 ENCOUNTER — Encounter: Payer: Self-pay | Admitting: Emergency Medicine

## 2020-10-15 ENCOUNTER — Emergency Department: Payer: Self-pay

## 2020-10-15 DIAGNOSIS — L03818 Cellulitis of other sites: Secondary | ICD-10-CM

## 2020-10-15 DIAGNOSIS — L03314 Cellulitis of groin: Secondary | ICD-10-CM | POA: Insufficient documentation

## 2020-10-15 LAB — BASIC METABOLIC PANEL
Anion gap: 10 (ref 5–15)
BUN: 20 mg/dL (ref 6–20)
CO2: 30 mmol/L (ref 22–32)
Calcium: 9.4 mg/dL (ref 8.9–10.3)
Chloride: 100 mmol/L (ref 98–111)
Creatinine, Ser: 1.16 mg/dL (ref 0.61–1.24)
GFR, Estimated: >60 mL/min (ref >60)
Glucose, Bld: 98 mg/dL (ref 70–99)
Potassium: 4.3 mmol/L (ref 3.5–5.1)
Sodium: 140 mmol/L (ref 135–145)

## 2020-10-15 LAB — CBC
HCT: 45.5 % (ref 39.0–52.0)
Hemoglobin: 14.7 g/dL (ref 13.0–17.0)
MCH: 28.9 pg (ref 26.0–34.0)
MCHC: 32.3 g/dL (ref 30.0–36.0)
MCV: 89.6 fL (ref 80.0–100.0)
Platelets: 341 10*3/uL (ref 150–400)
RBC: 5.08 MIL/uL (ref 4.22–5.81)
RDW: 13 % (ref 11.5–15.5)
WBC: 8.4 10*3/uL (ref 4.0–10.5)
nRBC: 0 % (ref 0.0–0.2)

## 2020-10-15 MED ORDER — VANCOMYCIN HCL 1500 MG/300ML IV SOLN
1500.0000 mg | Freq: Once | INTRAVENOUS | Status: AC
  Administered 2020-10-15: 1500 mg via INTRAVENOUS
  Filled 2020-10-15: qty 300

## 2020-10-15 MED ORDER — CLINDAMYCIN HCL 300 MG PO CAPS: 300.0000 mg | ORAL_CAPSULE | Freq: Three times a day (TID) | ORAL | 0 refills | Status: AC

## 2020-10-15 MED ORDER — SODIUM CHLORIDE 0.9 % IV SOLN
2.0000 g | Freq: Once | INTRAVENOUS | Status: AC
  Administered 2020-10-15: 2 g via INTRAVENOUS
  Filled 2020-10-15: qty 20

## 2020-10-15 MED ORDER — VANCOMYCIN HCL IN DEXTROSE 1-5 GM/200ML-% IV SOLN
1000.0000 mg | Freq: Once | INTRAVENOUS | Status: AC
  Administered 2020-10-15: 1000 mg via INTRAVENOUS
  Filled 2020-10-15: qty 200

## 2020-10-15 MED ORDER — ONDANSETRON 4 MG PO TBDP: 4.0000 mg | ORAL_TABLET | Freq: Four times a day (QID) | ORAL | 0 refills | Status: DC | PRN

## 2020-10-15 NOTE — Consult Note (Signed)
PHARMACY -  BRIEF ANTIBIOTIC NOTE   Pharmacy has received consult(s) for Vancomycin from an ED provider.  The patient's profile has been reviewed for ht/wt/allergies/indication/available labs.    One time order(s) placed for: Vancomycin 1g + 1.5g dose to complete 2.5g loading dose x1 Also ordered for CTX 2g x1  Further antibiotics/pharmacy consults should be ordered by admitting physician if indicated.                       Thank you, Martyn Malay 10/15/2020  4:18 PM

## 2020-10-15 NOTE — ED Notes (Signed)
Pt given TV remote at this time.  

## 2020-10-15 NOTE — ED Provider Notes (Signed)
[facility] Emergency Department Provider Note   ____________________________________________   Event Date/Time   First MD Initiated Contact with Patient 10/15/20 1557     (approximate)  I have reviewed the triage vital signs and the nursing notes.   HISTORY  Chief Complaint Groin Swelling    HPI Francis Sanchez is a 40 y.o. male no major past medical history  Patient reports about 2 days ago he had a small area over his pubic region where he had a small spot that looked a little like a "bug bite" and over the last 2 days it has become red warm and slightly swollen.  Also some discomfort a little bit along his right groin.  He has a little bit of redness that is also starting to extend to the base of the penis.  No fevers or chills.  No nausea or vomiting.  He reports it seemed like maybe an infected hair follicle or something started it.  No chest pain no trouble breathing.  No abdominal pain.  No swelling into the scrotum.   Past Medical History:  Diagnosis Date   GERD (gastroesophageal reflux disease)     Patient Active Problem List   Diagnosis Date Noted   Dysphagia    Acute esophagitis    Acne rosacea 10/17/2019    Past Surgical History:  Procedure Laterality Date   ESOPHAGOGASTRODUODENOSCOPY (EGD) WITH PROPOFOL N/A 10/30/2019   Procedure: ESOPHAGOGASTRODUODENOSCOPY (EGD) WITH PROPOFOL;  Surgeon: Pasty Spillers, MD;  Location: [facility] ENDOSCOPY;  Service: Endoscopy;  Laterality: N/A;   HERNIA REPAIR     wisdon teeth extraction      Prior to Admission medications   Medication Sig Start Date End Date Taking? Authorizing Provider  clindamycin (CLEOCIN) 300 MG capsule Take 1 capsule (300 mg total) by mouth 3 (three) times daily for 10 days. 10/15/20 10/25/20 Yes Sharyn Creamer, MD  ondansetron (ZOFRAN ODT) 4 MG disintegrating tablet Take 1 tablet (4 mg total) by mouth every 6 (six) hours as needed for nausea or vomiting. 10/15/20  Yes  Sharyn Creamer, MD    Allergies Shellfish allergy, Bee venom, and Other  Family History  Problem Relation Age of Onset   Hypertension Mother    Hyperlipidemia Mother    Allergic rhinitis Mother    Hypertension Father    Hyperlipidemia Father    CVA Father    Diabetes Father    Heart attack Father     Social History Social History   Tobacco Use   Smoking status: Never   Smokeless tobacco: Never  Vaping Use   Vaping Use: Never used  Substance Use Topics   Alcohol use: Never   Drug use: Yes    Types: Marijuana    Review of Systems Constitutional: No fever/chills Eyes: No visual changes. Cardiovascular: Denies chest pain. Respiratory: Denies shortness of breath. Gastrointestinal: No abdominal pain.   Genitourinary: Negative for dysuria. Musculoskeletal: Negative for back pain. Skin: Negative for rash except as noted in HPI. Neurological: Negative for headaches, areas of focal weakness or numbness.    ____________________________________________   PHYSICAL EXAM:  VITAL SIGNS: ED Triage Vitals [10/15/20 1442]  Enc Vitals Group     BP (!) 139/97     Pulse Rate 94     Resp 20     Temp 98.9 F (37.2 C)     Temp Source Oral     SpO2 96 %     Weight 260 lb (117.9 kg)  Height 5\' 11"  (1.803 m)     Head Circumference      Peak Flow      Pain Score 7     Pain Loc      Pain Edu?      Excl. in GC?     Constitutional: Alert and oriented. Well appearing and in no acute distress. Eyes: Conjunctivae are normal. Head: Atraumatic. Nose: No congestion/rhinnorhea. Mouth/Throat: Mucous membranes are moist. Neck: No stridor.  Cardiovascular: Normal rate, regular rhythm. Grossly normal heart sounds.  Good peripheral circulation. Respiratory: Normal respiratory effort.  No retractions. Lungs CTAB. Gastrointestinal: Soft and nontender. No distention. Musculoskeletal: No lower extremity tenderness nor edema. Neurologic:  Normal speech and language. No gross focal  neurologic deficits are appreciated.  Skin:  Skin is warm, dry and intact. No rash noted except for an area approximately Palmer in size over the mons pubis where there is mild erythema, minimal edema, extends to approximately the base of the penile shaft but does not extend to the scrotum or perineum.  Also patient reports mild tenderness with appears to be some possible shotty tender lymphadenopathy along the right inguinal region but no mass.  No obvious abscess and no draining collection. Psychiatric: Mood and affect are normal. Speech and behavior are normal.  ____________________________________________   LABS (all labs ordered are listed, but only abnormal results are displayed)  Labs Reviewed  CULTURE, BLOOD (SINGLE)  CBC  BASIC METABOLIC PANEL   ____________________________________________  EKG   ____________________________________________  RADIOLOGY  CT PELVIS WO CONTRAST  Result Date: 10/15/2020 CLINICAL DATA:  40 year old male with redness across mons pubis. EXAM: CT PELVIS WITHOUT CONTRAST TECHNIQUE: Multidetector CT imaging of the pelvis was performed following the standard protocol without intravenous contrast. COMPARISON:  CT abdomen pelvis dated 07/06/2012. FINDINGS: Evaluation of this exam is limited in the absence of intravenous contrast. Urinary Tract:  No abnormality visualized. Bowel:  Unremarkable visualized pelvic bowel loops. Vascular/Lymphatic: No pathologically enlarged lymph nodes. No significant vascular abnormality seen. Reproductive: The prostate and seminal vesicles are grossly unremarkable. Other: There is diffuse stranding and edema of the subcutaneous soft tissues of the mons pubis with edema extending into the skin and superficial soft tissues of the penis. Findings most consistent with cellulitis. Clinical correlation recommended. No drainable fluid collection or abscess. No soft tissue gas. Evidence of prior right inguinal hernia repair.  Musculoskeletal: No suspicious bone lesions identified. Small fat containing umbilical and left inguinal hernia. IMPRESSION: Findings most consistent with cellulitis of the mons pubis. No drainable fluid collection/abscess. No soft tissue gas. Electronically Signed   By: 09/05/2012 M.D.   On: 10/15/2020 16:30    Imaging reviewed as above, also discussed with neurology Dr. 10/17/2020 ____________________________________________   PROCEDURES  Procedure(s) performed: None  Procedures  Critical Care performed: No  ____________________________________________   INITIAL IMPRESSION / ASSESSMENT AND PLAN / ED COURSE  Pertinent labs & imaging results that were available during my care of the patient were reviewed by me and considered in my medical decision making (see chart for details).   Patient presents with what appears to be cellulitis of the mons pubis.  There is no evidence of sepsis, he is nontoxic and well-appearing.  No evidence of necrotizing fasciitis or gas-forming organism.  No evidence of Fournier's  Discussed his case with our urologist, formulated plan, antibiotics administered in the ED, and plan to discharge with clindamycin and close urology follow-up plan for tomorrow.  The this is a very agreeable plan, and  the patient and his wife are both understanding agreement with this plan and careful return precautions.  Clinical Course as of 10/15/20 2019  Tue Oct 15, 2020  1711 Discussed this patient's case and imaging and presentation with Dr. Lonna Cobb of urology.  He advises and agreeable with plan for clindamycin and patient has an appointment for tomorrow with urology for which the team can follow-up with him regarding cellulitis over the mons pubis region.  Dr. Lonna Cobb will notify the provider he is seeing tomorrow, patient incidentally had an appointment with [facility] urologic already scheduled for tomorrow for vasectomy evaluation.  Patient and his wife are both  understanding agreeable with plan to keep his appointment tomorrow with urology for reevaluation of his cellulitis as well. [MQ]    Clinical Course User Index [MQ] Sharyn Creamer, MD    ----------------------------------------- 8:19 PM on 10/15/2020 ----------------------------------------- Patient resting comfortably.  Plan of care understood, following up tomorrow with urology, antibiotic prescriptions which she will pick up at [facility].  Patient and wife comfortable with plan for discharge, just about completed on his vancomycin infusion at this time  Return precautions and treatment recommendations and follow-up discussed with the patient who is agreeable with the plan.  ____________________________________________   FINAL CLINICAL IMPRESSION(S) / ED DIAGNOSES  Final diagnoses:  Cellulitis of other specified site        Note:  This document was prepared using Dragon voice recognition software and may include unintentional dictation errors       Sharyn Creamer, MD 10/15/20 2019

## 2020-10-15 NOTE — ED Triage Notes (Signed)
Pt reports that he noticed what looked like a bug bite 48 hours ago, it was ok yesterday, but today he reports that his groin is swollen and is going into his penis. He reports that the skin is hard and hot, denies any drainage. He can feel something on the right side of his groin. He has hernia surgery in 2005 in that area.

## 2020-10-15 NOTE — Discharge Instructions (Addendum)
Please follow-up at [facility] urologic tomorrow, please attend your prescheduled appointment.

## 2020-10-16 ENCOUNTER — Ambulatory Visit (INDEPENDENT_AMBULATORY_CARE_PROVIDER_SITE_OTHER): Payer: Medicaid Other | Admitting: Urology

## 2020-10-16 ENCOUNTER — Encounter: Payer: Self-pay | Admitting: Urology

## 2020-10-16 VITALS — BP 136/88 | HR 0 | Ht 71.0 in | Wt 260.0 lb

## 2020-10-16 DIAGNOSIS — L03319 Cellulitis of trunk, unspecified: Secondary | ICD-10-CM | POA: Diagnosis not present

## 2020-10-16 DIAGNOSIS — Z3009 Encounter for other general counseling and advice on contraception: Secondary | ICD-10-CM

## 2020-10-16 NOTE — Patient Instructions (Signed)

## 2020-10-17 MED ORDER — DIAZEPAM 10 MG PO TABS: ORAL_TABLET | ORAL | 0 refills | Status: DC

## 2020-10-17 NOTE — Progress Notes (Signed)
10/16/2020 7:04 AM   Francis Sanchez Jan 04, 1981 [id number]  Referring provider: No referring provider defined for this encounter.  Chief Complaint  Patient presents with   VAS Consult    HPI: Francis Sanchez is a 40 y.o. male who presents today with his wife for vasectomy counseling.  He was also seen in the ED yesterday for suprapubic cellulitis and the ED physician contacted me to see if I would follow-up on this problem  Married with 3 children Isolated episode of epididymitis several years ago which resolved after antibiotics Prior right inguinal hernia repair 2005 No history of bleeding or clotting disorders  He was felt to have cellulitis secondary to infected hair follicle and received IV vancomycin in the ED and was discharged on clindamycin He states the redness has significantly improved Denies fever or chills   PMH: Past Medical History:  Diagnosis Date   GERD (gastroesophageal reflux disease)     Surgical History: Past Surgical History:  Procedure Laterality Date   ESOPHAGOGASTRODUODENOSCOPY (EGD) WITH PROPOFOL N/A 10/30/2019   Procedure: ESOPHAGOGASTRODUODENOSCOPY (EGD) WITH PROPOFOL;  Surgeon: Pasty Spillers, MD;  Location: [facility] ENDOSCOPY;  Service: Endoscopy;  Laterality: N/A;   HERNIA REPAIR     wisdon teeth extraction      Home Medications:  Allergies as of 10/16/2020       Reactions   Shellfish Allergy Swelling   Bee Venom Swelling   Other Swelling   Other reaction(s): Other (See Comments) Metal taste in mouth        Medication List        Accurate as of October 16, 2020 11:59 PM. If you have any questions, ask your nurse or doctor.          clindamycin 300 MG capsule Commonly known as: CLEOCIN Take 1 capsule (300 mg total) by mouth 3 (three) times daily for 10 days.   ondansetron 4 MG disintegrating tablet Commonly known as: Zofran ODT Take 1 tablet (4 mg total) by mouth every 6 (six) hours as needed for nausea or  vomiting.        Allergies:  Allergies  Allergen Reactions   Shellfish Allergy Swelling   Bee Venom Swelling   Other Swelling    Other reaction(s): Other (See Comments) Metal taste in mouth    Family History: Family History  Problem Relation Age of Onset   Hypertension Mother    Hyperlipidemia Mother    Allergic rhinitis Mother    Hypertension Father    Hyperlipidemia Father    CVA Father    Diabetes Father    Heart attack Father     Social History:  reports that he has never smoked. He has never used smokeless tobacco. He reports current drug use. Drug: Marijuana. He reports that he does not drink alcohol.   Physical Exam: BP 136/88   Pulse (!) 0   Ht 5\' 11"  (1.803 m)   Wt 260 lb (117.9 kg)   BMI 36.26 kg/m   Constitutional:  Alert and oriented, No acute distress. HEENT: [place] AT, moist mucus membranes.  Trachea midline, no masses. Cardiovascular: No clubbing, cyanosis, or edema. Respiratory: Normal respiratory effort, no increased work of breathing. GI: Abdomen is soft, nontender, nondistended, no abdominal masses GU: Mild suprapubic erythema without tenderness.  No fluctuance or drainage.  Edema of the proximal penile shaft was noted by the ED physician yesterday which has resolved, testes descended bilaterally without masses or tenderness, spermatic cord/epididymis palpably normal bilaterally.  Some fullness to  the left spermatic cord and there may be a cord lipoma.  Vasa palpable bilaterally though more difficult to palpate on the left Skin: Suprapubic cellulitis improving as above Neurologic: Grossly intact, no focal deficits, moving all 4 extremities. Psychiatric: Normal mood and affect.   Assessment & Plan:    1.  Undesired fertility Desires to schedule vasectomy We had a long discussion about vasectomy. We specifically discussed the procedure, recovery and the risks, benefits and alternatives of vasectomy. I explained that the procedure entails removal of a  segment of each vas deferens, each of which conducts sperm, and that the purpose of this procedure is to cause sterility (inability to produce children or cause pregnancy). Vasectomy is intended to be permanent and irreversible form of contraception. Options for fertility after vasectomy include vasectomy reversal, or sperm retrieval with in vitro fertilization. These options are not always successful, and they may be expensive. We discussed reversible forms of birth control such as condoms, IUD or diaphragms, as well as the option of freezing sperm in a sperm bank prior to the vasectomy procedure. We discussed the importance of avoiding strenuous exercise for four days after vasectomy, and the importance of refraining from any form of ejaculation for seven days after vasectomy. I explained that vasectomy does not produce immediate sterility so another form of contraceptive must be used until sterility is assured by having semen checked for sperm. Thus, a post vasectomy semen analysis is necessary to confirm sterility. Rarely, vasectomy must be repeated. We discussed the approximately 1 in 2,000 risk of pregnancy after vasectomy for men who have post-vasectomy semen analysis showing absent sperm or rare non-motile sperm. Typical side effects include a small amount of oozing blood, some discomfort and mild swelling in the area of incision.  Vasectomy does not affect sexual performance, function, please, sensation, interest, desire, satisfaction, penile erection, volume of semen or ejaculation. Other rare risks include allergy or adverse reaction to an anesthetic, testicular atrophy, hematoma, infection/abscess, prolonged tenderness of the vas deferens, pain, swelling, painful nodule or scar (called sperm granuloma) or epididymtis. We discussed chronic testicular pain syndrome. This has been reported to occur in as many as 1-2% of men and may be permanent. This can be treated with medication, small procedures or  (rarely) surgery. Valium 10 mg as a preprocedure anxiolytic sent to pharmacy and he was informed he would need a driver if utilizing this medication  2.  Suprapubic cellulitis Improving Continue clindamycin Call for any progressive erythema, pain or fever   Riki Altes, MD  [facility] [address] [place], [place] [address] [phone]

## 2020-10-20 LAB — CULTURE, BLOOD (SINGLE)
Culture: NO GROWTH
Special Requests: ADEQUATE

## 2020-11-27 ENCOUNTER — Other Ambulatory Visit: Payer: Self-pay

## 2020-11-27 ENCOUNTER — Encounter: Payer: Self-pay | Admitting: Urology

## 2020-11-27 ENCOUNTER — Ambulatory Visit (INDEPENDENT_AMBULATORY_CARE_PROVIDER_SITE_OTHER): Payer: Medicaid Other | Admitting: Urology

## 2020-11-27 VITALS — BP 115/78 | HR 90 | Ht 72.0 in | Wt 260.0 lb

## 2020-11-27 DIAGNOSIS — Z302 Encounter for sterilization: Secondary | ICD-10-CM | POA: Diagnosis not present

## 2020-11-27 MED ORDER — HYDROCODONE-ACETAMINOPHEN 5-325 MG PO TABS: 1.0000 | ORAL_TABLET | ORAL | 0 refills | Status: DC | PRN

## 2020-11-27 NOTE — Patient Instructions (Signed)

## 2020-11-28 NOTE — Progress Notes (Signed)
Vasectomy Procedure Note  Indications: Francis Sanchez is a 40 y.o. male who presents today for elective sterilization.  He has been consented for the procedure.  He is aware of the risks and benefits.  He had no additional questions.  He agrees to proceed.  He denies any other significant change since his last visit.  Pre-operative Diagnosis: Elective sterilization  Post-operative Diagnosis: Elective sterilization  Premedication: Valium 10 mg po  Surgeon: Lorin Picket C. Terryn Redner, M.D  Description: The patient was prepped and draped in the standard fashion.  The right vas deferens was identified and brought superiorly to the anterior scrotal skin.  The skin and vas was then anesthetized utilizing 8 ml 1% lidocaine.  A small stab incision was made and spread with the vas dissector.  The vas was grasped utilizing the vas clamp and elevated out of the incision.  The vas was dissected free from surrounding tissue and vessels and an ~1 cm segment was excised.  The vas lumens were cauterized utilizing electrocautery.  The distal segment was buried in the surrounding sheath with a 3-0 chromic suture.  No significant bleeding was observed.  The vas ends were then dropped back into the hemiscrotum.  The skin was closed with hemostatic pressure.  An identical procedure was performed on the contralateral side.  Clean dry gauze was applied to the incision sites.  During performance of the second side he did have nausea and a vasovagal episode.  He was observed post procedure with resolution of his symptoms.    Complications:None  Recommendations: 1.  No lifting greater than 10 pounds or strenuous activity for 1 week. 2.  Scrotal support for 1 week. 3.  Shower only for 1 week; may shower in the morning 4.  May resume intercourse in one week if no significant discomfort.  Continue alternate contraception for 12 weeks.  5.  Call for significant pain, swelling, redness, drainage or fever greater than 100.5. 6.   Rx hydrocodone/APAP 5/325 1-2 every 6 hours as needed for pain. 7.  Follow-up semen analysis in 12 weeks.   Irineo Axon, MD

## 2020-11-30 ENCOUNTER — Encounter: Payer: Self-pay | Admitting: Urology

## 2020-12-02 HISTORY — PX: VASECTOMY: SHX75

## 2021-02-27 ENCOUNTER — Other Ambulatory Visit: Payer: Self-pay

## 2021-02-27 ENCOUNTER — Other Ambulatory Visit: Payer: Medicaid Other

## 2021-02-27 DIAGNOSIS — Z302 Encounter for sterilization: Secondary | ICD-10-CM

## 2021-03-01 ENCOUNTER — Encounter: Payer: Self-pay | Admitting: Urology

## 2021-03-01 LAB — POST-VAS SPERM EVALUATION,QUAL: Volume: 1.9 mL

## 2021-03-03 ENCOUNTER — Telehealth: Payer: Self-pay | Admitting: *Deleted

## 2021-03-03 NOTE — Telephone Encounter (Signed)
Bag up front, left message for patient to call back

## 2021-03-03 NOTE — Telephone Encounter (Signed)
-----   Message from Riki Altes, MD sent at 03/02/2021  8:36 PM EDT ----- Semen analysis did show sperm present.  The initial test does not differentiate between live and nonmotile sperm.  Recommend continued alternate contraception and scheduling a postvasectomy semen sample to determine count and motility.  Please schedule appointment at [facility]

## 2021-03-03 NOTE — Telephone Encounter (Signed)
Pt aware. Will come and pick up bag.

## 2021-05-27 ENCOUNTER — Other Ambulatory Visit: Payer: Self-pay

## 2021-05-27 ENCOUNTER — Ambulatory Visit
Admission: EM | Admit: 2021-05-27 | Discharge: 2021-05-27 | Disposition: A | Discharge status: Home / Self Care | Payer: Medicaid Other | Attending: Internal Medicine | Admitting: Internal Medicine

## 2021-05-27 DIAGNOSIS — Z23 Encounter for immunization: Secondary | ICD-10-CM | POA: Diagnosis not present

## 2021-05-27 DIAGNOSIS — S0502XA Injury of conjunctiva and corneal abrasion without foreign body, left eye, initial encounter: Secondary | ICD-10-CM

## 2021-05-27 MED ORDER — TOBRAMYCIN-DEXAMETHASONE 0.3-0.1 % OP SUSP: 2.0000 [drp] | Freq: Four times a day (QID) | OPHTHALMIC | 0 refills | Status: DC

## 2021-05-27 MED ORDER — FLUORESCEIN SODIUM 1 MG OP STRP
1.0000 | ORAL_STRIP | Freq: Once | OPHTHALMIC | Status: AC
  Administered 2021-05-27: 17:00:00 1 via OPHTHALMIC

## 2021-05-27 MED ORDER — TETANUS-DIPHTH-ACELL PERTUSSIS 5-2.5-18.5 LF-MCG/0.5 IM SUSY
0.5000 mL | PREFILLED_SYRINGE | Freq: Once | INTRAMUSCULAR | Status: AC
  Administered 2021-05-27: 17:00:00 0.5 mL via INTRAMUSCULAR

## 2021-05-27 MED ORDER — TETRACAINE HCL 0.5 % OP SOLN
2.0000 [drp] | Freq: Once | OPHTHALMIC | Status: AC
  Administered 2021-05-27: 17:00:00 2 [drp] via OPHTHALMIC
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
## 2021-05-27 NOTE — Discharge Instructions (Signed)
Instill 2 drops of TobraDex in your left eye 4 times a day for 5 days.  Abstain from wearing contacts until your symptoms have completely resolved.  When you do resume wearing contacts start with a fresh pair.  Make sure that you are storing your contracts in a clean with clean saline nightly.  Wash your contacts with an enzymatic cleaner once weekly and rinse them thoroughly with new, clean contact lens solution to remove any enzymatic residue.  If you have any worsening of your symptoms such as increased pain, increased redness, increased photosensitivity, or changes in your vision you need to follow-up with your eye doctor.  
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
## 2021-06-20 IMAGING — CT CT PELVIS W/O CM
2 of 3 series · 17 of 46 positions shown, 19 images · non-contrast
Comparison: CT abdomen pelvis dated 07/06/2012.

CLINICAL DATA: 40-year-old male with redness across mons pubis.

EXAM:
CT PELVIS WITHOUT CONTRAST
TECHNIQUE: Multidetector CT imaging of the pelvis was performed following the
standard protocol without intravenous contrast.

[Series 2: routine abd/pel wo · axial · 0.90mm/px · z∈[-1153,-838]mm · 14 of 73 slices shown, 16 images]
[im 5/73  soft-tissue]
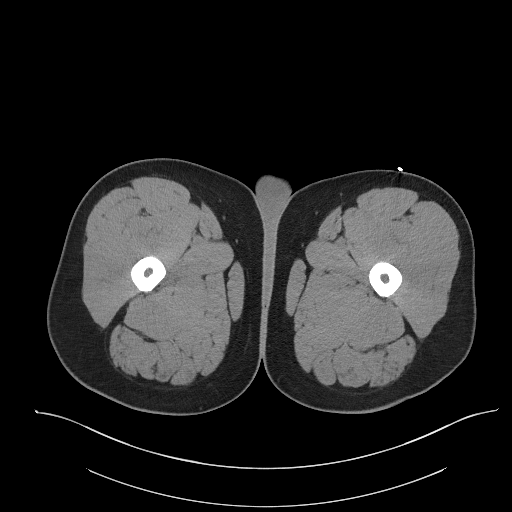
[im 5/73  bone]
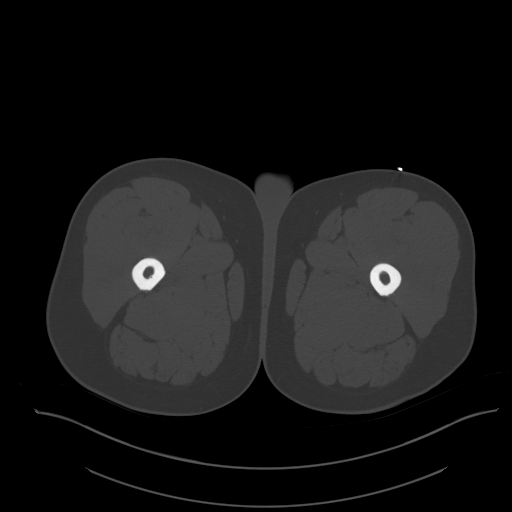
[im 10/73  soft-tissue]
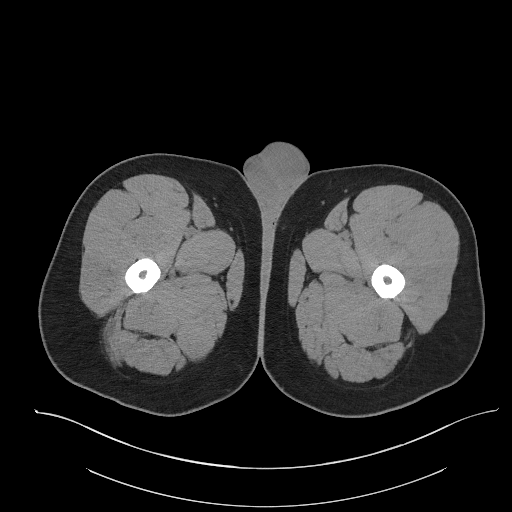
[im 14/73  soft-tissue]
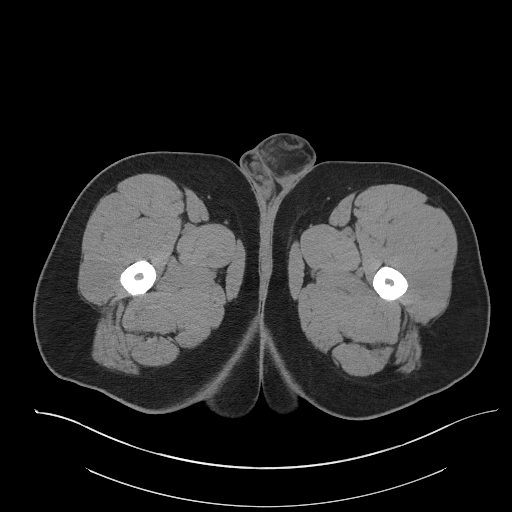
[im 19/73  soft-tissue]
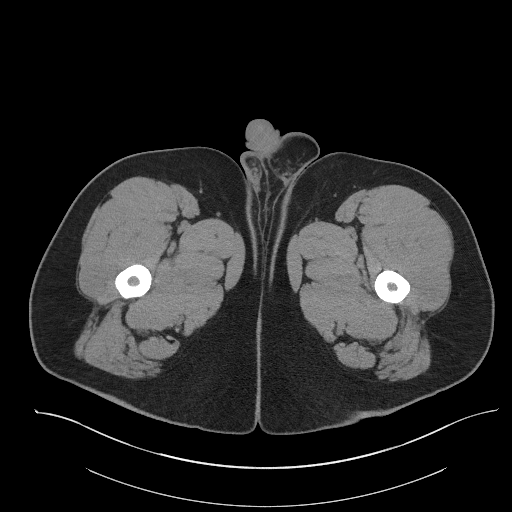
[im 24/73  soft-tissue]
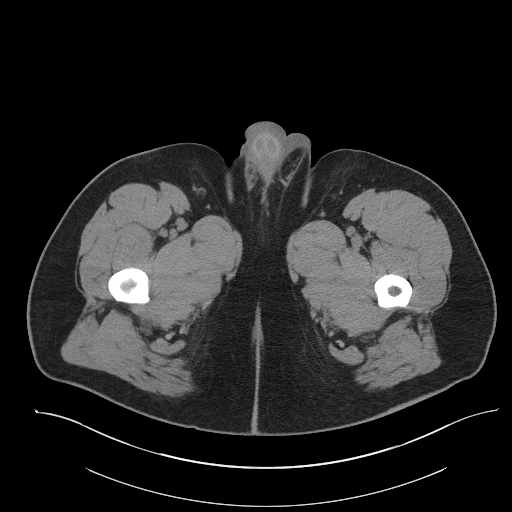
[im 28/73  soft-tissue]
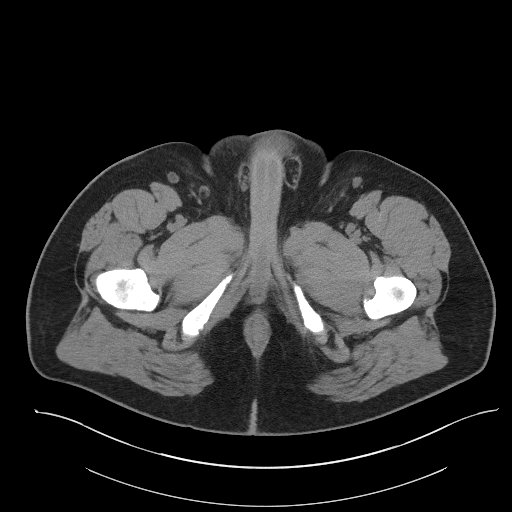
[im 33/73  soft-tissue]
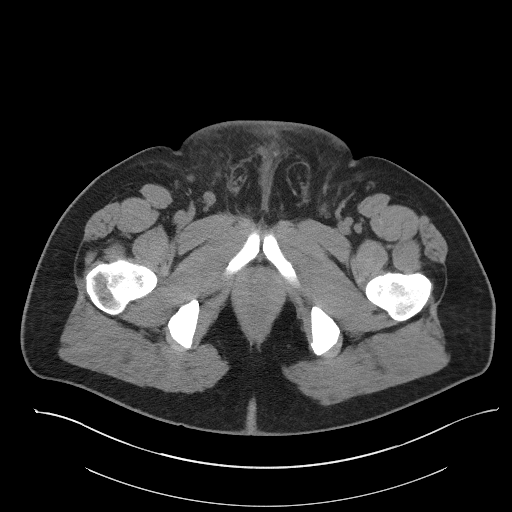
[im 40/73  soft-tissue]
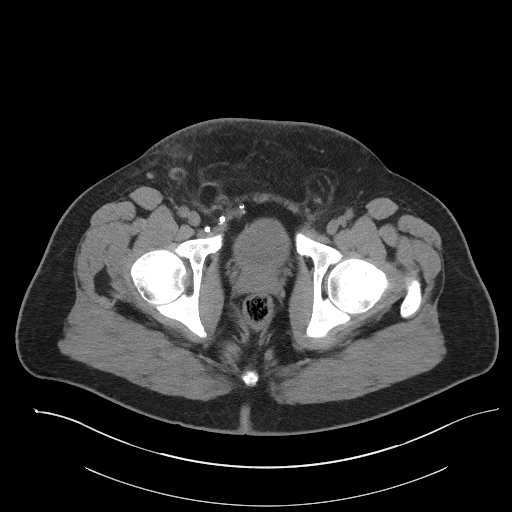
[im 45/73  soft-tissue]
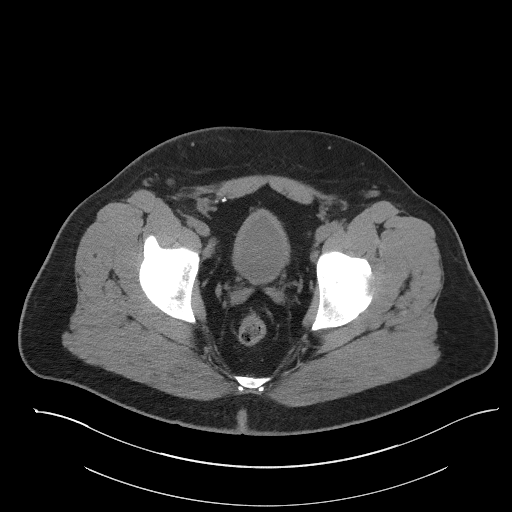
[im 45/73  bone]
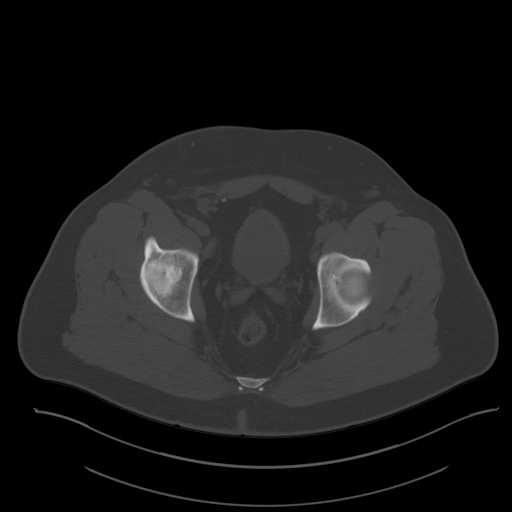
[im 49/73  soft-tissue]
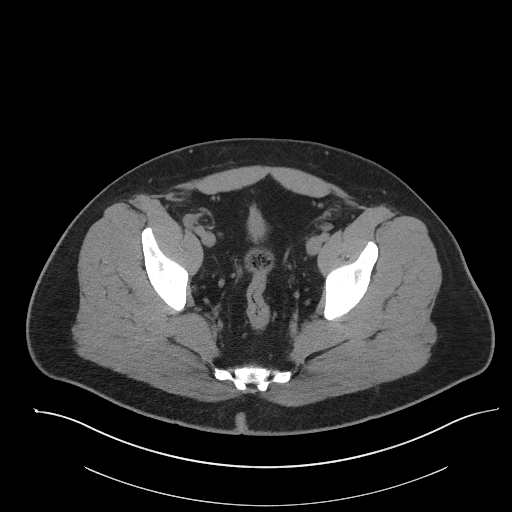
[im 54/73  soft-tissue]
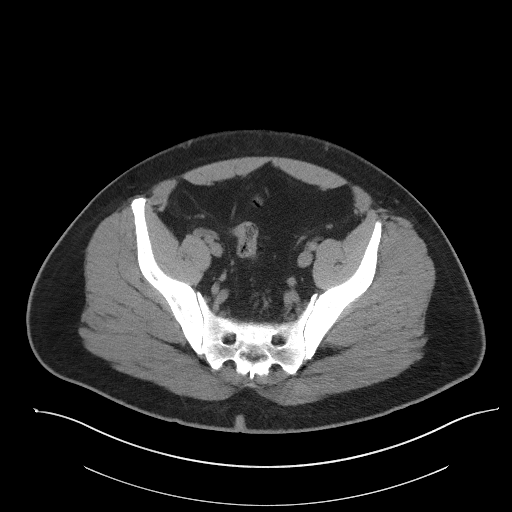
[im 59/73  soft-tissue]
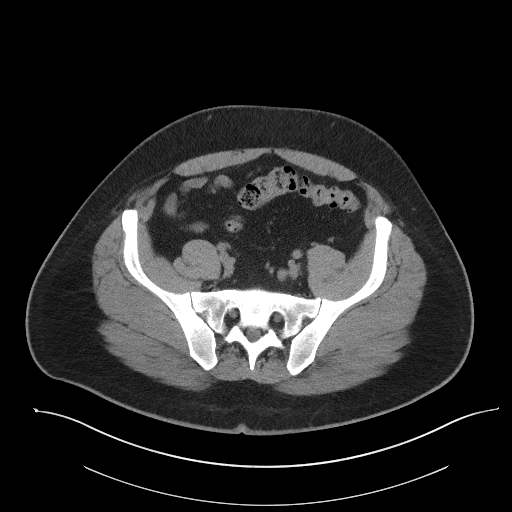
[im 63/73  soft-tissue]
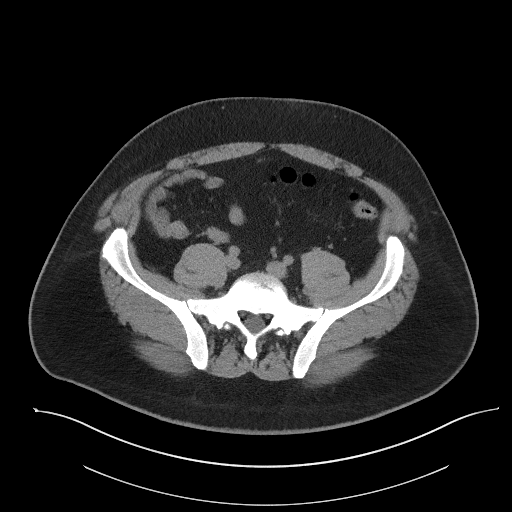
[im 68/73  soft-tissue]
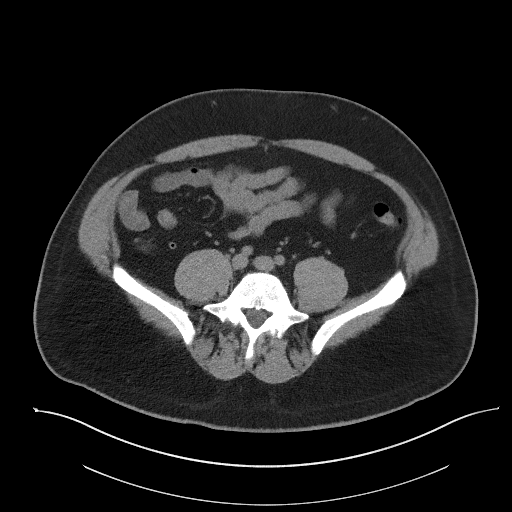

[Series 5: coronal st · coronal · 0.72mm/px · 3 of 101 slices shown]
[im 34/101  soft-tissue]
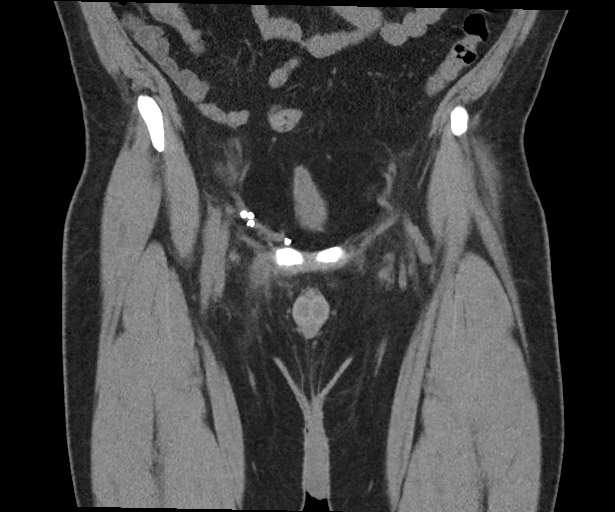
[im 45/101  soft-tissue]
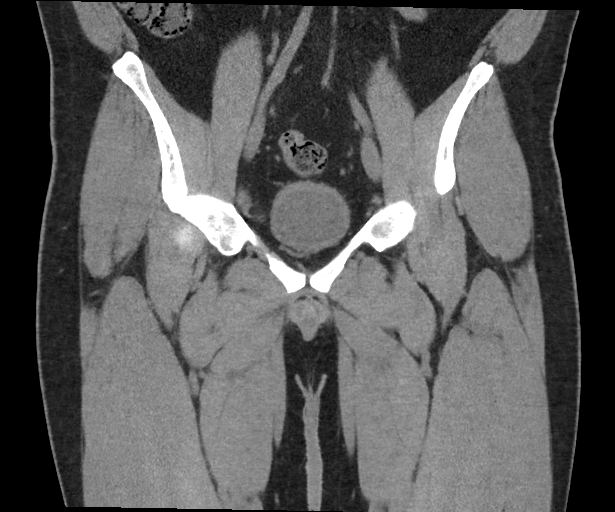
[im 56/101  soft-tissue]
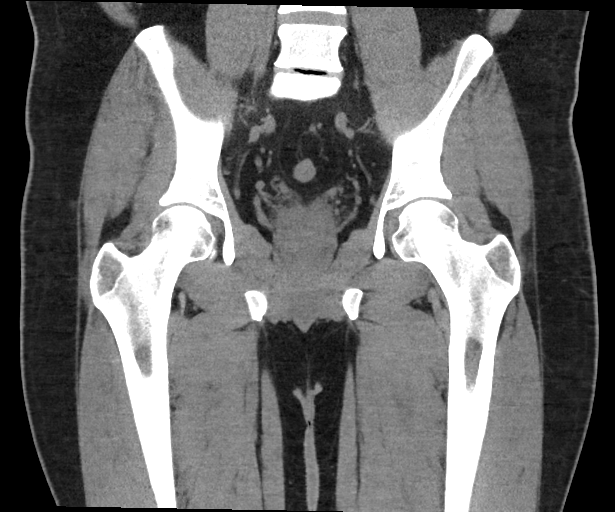

[17 of 46 positions shown; findings below may reference images not displayed]

FINDINGS: Evaluation of this exam is limited in the absence of intravenous
contrast.

Urinary Tract:  No abnormality visualized.

Bowel:  Unremarkable visualized pelvic bowel loops.

Vascular/Lymphatic: No pathologically enlarged lymph nodes. No
significant vascular abnormality seen.

Reproductive: The prostate and seminal vesicles are grossly
unremarkable.

Other: There is diffuse stranding and edema of the subcutaneous soft
tissues of the mons pubis with edema extending into the skin and
superficial soft tissues of the penis. Findings most consistent with
cellulitis. Clinical correlation recommended. No drainable fluid
collection or abscess. No soft tissue gas.

Evidence of prior right inguinal hernia repair.

Musculoskeletal: No suspicious bone lesions identified. Small fat
containing umbilical and left inguinal hernia.
IMPRESSION: Findings most consistent with cellulitis of the mons pubis. No
drainable fluid collection/abscess. No soft tissue gas.

## 2022-01-08 ENCOUNTER — Emergency Department
Admission: EM | Admit: 2022-01-08 | Discharge: 2022-01-08 | Disposition: A | Discharge status: Home / Self Care | Payer: Medicaid Other | Attending: Emergency Medicine | Admitting: Emergency Medicine

## 2022-01-08 ENCOUNTER — Emergency Department: Payer: Medicaid Other

## 2022-01-08 ENCOUNTER — Other Ambulatory Visit: Payer: Self-pay

## 2022-01-08 DIAGNOSIS — R519 Headache, unspecified: Secondary | ICD-10-CM | POA: Insufficient documentation

## 2022-01-08 DIAGNOSIS — R0981 Nasal congestion: Secondary | ICD-10-CM | POA: Diagnosis not present

## 2022-01-08 LAB — COMPREHENSIVE METABOLIC PANEL
ALT: 31 U/L (ref 0–44)
AST: 25 U/L (ref 15–41)
Albumin: 4.5 g/dL (ref 3.5–5.0)
Alkaline Phosphatase: 58 U/L (ref 38–126)
Anion gap: 8 (ref 5–15)
BUN: 17 mg/dL (ref 6–20)
CO2: 28 mmol/L (ref 22–32)
Calcium: 9.5 mg/dL (ref 8.9–10.3)
Chloride: 102 mmol/L (ref 98–111)
Creatinine, Ser: 0.92 mg/dL (ref 0.61–1.24)
GFR, Estimated: >60 mL/min (ref >60)
Glucose, Bld: 136 mg/dL — ABNORMAL HIGH (ref 70–99)
Potassium: 4.4 mmol/L (ref 3.5–5.1)
Sodium: 138 mmol/L (ref 135–145)
Total Bilirubin: 0.6 mg/dL (ref 0.3–1.2)
Total Protein: 8.3 g/dL — ABNORMAL HIGH (ref 6.5–8.1)

## 2022-01-08 LAB — CBC WITH DIFFERENTIAL/PLATELET
Abs Immature Granulocytes: 0.04 10*3/uL (ref 0.00–0.07)
Basophils Absolute: 0.1 10*3/uL (ref 0.0–0.1)
Basophils Relative: 1 %
Eosinophils Absolute: 0.3 10*3/uL (ref 0.0–0.5)
Eosinophils Relative: 3 %
HCT: 48.7 % (ref 39.0–52.0)
Hemoglobin: 15.5 g/dL (ref 13.0–17.0)
Immature Granulocytes: 1 %
Lymphocytes Relative: 29 %
Lymphs Abs: 2.3 10*3/uL (ref 0.7–4.0)
MCH: 28.5 pg (ref 26.0–34.0)
MCHC: 31.8 g/dL (ref 30.0–36.0)
MCV: 89.5 fL (ref 80.0–100.0)
Monocytes Absolute: 0.5 10*3/uL (ref 0.1–1.0)
Monocytes Relative: 6 %
Neutro Abs: 5 10*3/uL (ref 1.7–7.7)
Neutrophils Relative %: 60 %
Platelets: 434 10*3/uL — ABNORMAL HIGH (ref 150–400)
RBC: 5.44 MIL/uL (ref 4.22–5.81)
RDW: 12.9 % (ref 11.5–15.5)
WBC: 8.2 10*3/uL (ref 4.0–10.5)
nRBC: 0 % (ref 0.0–0.2)

## 2022-01-08 MED ORDER — OXYMETAZOLINE HCL 0.05 % NA SOLN: 2.0000 | Freq: Two times a day (BID) | NASAL | 0 refills | Status: DC | PRN

## 2022-01-08 MED ORDER — BUTALBITAL-APAP-CAFFEINE 50-325-40 MG PO TABS: 1.0000 | ORAL_TABLET | Freq: Four times a day (QID) | ORAL | 0 refills | Status: DC | PRN

## 2022-01-08 NOTE — ED Provider Notes (Signed)
   [facility] Provider Note    Event Date/Time   First MD Initiated Contact with Patient 01/08/22 [phone]     (approximate)  History   Chief Complaint: Headache  HPI  Francis Sanchez is a 41 y.o. male with a past medical history of gastric reflux presents to the emergency department for intermittent headaches.  According to the patient over the past 3 months or so he has been experiencing intermittent headaches.  Describes his headache as moderate 6/10 currently.  Denies any severe headaches.  Patient states he has a family history of an aneurysm, states his wife was concerned so he came for evaluation.  Patient denies any weakness or numbness denies any fever.  Patient states he has had some recent congestion.  Physical Exam   Triage Vital Signs: ED Triage Vitals  Enc Vitals Group     BP 01/08/22 0831 (!) 166/105     Pulse Rate 01/08/22 0831 84     Resp 01/08/22 0831 17     Temp 01/08/22 0831 97.6 F (36.4 C)     Temp Source 01/08/22 0831 Oral     SpO2 01/08/22 0831 95 %     Weight 01/08/22 0938 250 lb (113.4 kg)     Height 01/08/22 0938 5\' 11"  (1.803 m)     Head Circumference --      Peak Flow --      Pain Score 01/08/22 0831 8     Pain Loc --      Pain Edu? --      Excl. in [place]? --     Most recent vital signs: Vitals:   01/08/22 0831  BP: (!) 166/105  Pulse: 84  Resp: 17  Temp: 97.6 F (36.4 C)  SpO2: 95%    General: Awake, no distress.  CV:  Good peripheral perfusion.  Regular rate and rhythm  Resp:  Normal effort.  Equal breath sounds bilaterally.  Abd:  No distention.  Soft, nontender.  No rebound or guarding.   ED Results / Procedures / Treatments   RADIOLOGY  I have reviewed and interpreted the CT head images.  I do not see any significant abnormality on my evaluation. CT scan read as negative by radiology   MEDICATIONS ORDERED IN ED: Medications - No data to display   IMPRESSION / MDM / ASSESSMENT AND PLAN / ED COURSE   I reviewed the triage vital signs and the nursing notes.  Patient's presentation is most consistent with acute presentation with potential threat to life or bodily function.  Patient presents emergency department for intermittent headache over the past few months.  Overall the patient appears well, no distress.  Denies any weakness or numbness slurred speech or confusion.  CT scan of the head is normal.  CBC is normal, chemistry is normal.  CT scan does show some paranasal sinus disease and patient does states some congestion as well possibly indicating more sinus headaches.  I discussed with the patient a trial of Fioricet as well as Afrin to be used as needed for headaches.  We will also will refer to neurology for evaluation.  Patient agreeable to plan of care.  Reassured by the negative work-up.  FINAL CLINICAL IMPRESSION(S) / ED DIAGNOSES   Headaches Sinus congestion   Note:  This document was prepared using Dragon voice recognition software and may include unintentional dictation errors.   03/10/22, MD 01/08/22 [phone]

## 2022-01-08 NOTE — ED Triage Notes (Addendum)
Pt presents to ED with c/o of intermittent headache  for the past few months. Pt states today is the worst. Pt denies any visual disturbances. Pt denies injury or fall. Pt ambulatory with steady gait to triage. Pt states pain is localized to frontal area.   Pt states family HX of aneurysms.   Pt denies blood thinner use.

## 2022-09-09 ENCOUNTER — Emergency Department: Payer: Medicaid Other

## 2022-09-09 ENCOUNTER — Inpatient Hospital Stay
Admission: EM | Admit: 2022-09-09 | Discharge: 2022-09-11 | DRG: 179 | Disposition: A | Discharge status: Home / Self Care | Payer: Medicaid Other | Attending: Internal Medicine | Admitting: Internal Medicine

## 2022-09-09 ENCOUNTER — Other Ambulatory Visit: Payer: Self-pay

## 2022-09-09 DIAGNOSIS — R1310 Dysphagia, unspecified: Secondary | ICD-10-CM | POA: Diagnosis present

## 2022-09-09 DIAGNOSIS — R739 Hyperglycemia, unspecified: Secondary | ICD-10-CM | POA: Diagnosis present

## 2022-09-09 DIAGNOSIS — K2100 Gastro-esophageal reflux disease with esophagitis, without bleeding: Secondary | ICD-10-CM | POA: Diagnosis present

## 2022-09-09 DIAGNOSIS — J690 Pneumonitis due to inhalation of food and vomit: Principal | ICD-10-CM | POA: Diagnosis present

## 2022-09-09 DIAGNOSIS — Z9109 Other allergy status, other than to drugs and biological substances: Secondary | ICD-10-CM

## 2022-09-09 DIAGNOSIS — Z8701 Personal history of pneumonia (recurrent): Secondary | ICD-10-CM

## 2022-09-09 DIAGNOSIS — K209 Esophagitis, unspecified without bleeding: Secondary | ICD-10-CM | POA: Diagnosis present

## 2022-09-09 DIAGNOSIS — J189 Pneumonia, unspecified organism: Principal | ICD-10-CM | POA: Diagnosis present

## 2022-09-09 DIAGNOSIS — Z91030 Bee allergy status: Secondary | ICD-10-CM

## 2022-09-09 DIAGNOSIS — Z91013 Allergy to seafood: Secondary | ICD-10-CM

## 2022-09-09 DIAGNOSIS — A419 Sepsis, unspecified organism: Secondary | ICD-10-CM | POA: Diagnosis present

## 2022-09-09 LAB — URINALYSIS, ROUTINE W REFLEX MICROSCOPIC
Bacteria, UA: NONE SEEN
Bilirubin Urine: NEGATIVE
Glucose, UA: NEGATIVE mg/dL
Ketones, ur: NEGATIVE mg/dL
Leukocytes,Ua: NEGATIVE
Nitrite: NEGATIVE
Protein, ur: NEGATIVE mg/dL
Specific Gravity, Urine: 1.01 (ref 1.005–1.030)
pH: 6 (ref 5.0–8.0)

## 2022-09-09 LAB — CBC
HCT: 46.4 % (ref 39.0–52.0)
Hemoglobin: 15 g/dL (ref 13.0–17.0)
MCH: 28.8 pg (ref 26.0–34.0)
MCHC: 32.3 g/dL (ref 30.0–36.0)
MCV: 89.1 fL (ref 80.0–100.0)
Platelets: 355 10*3/uL (ref 150–400)
RBC: 5.21 MIL/uL (ref 4.22–5.81)
RDW: 13.6 % (ref 11.5–15.5)
WBC: 7.3 10*3/uL (ref 4.0–10.5)
nRBC: 0 % (ref 0.0–0.2)

## 2022-09-09 LAB — BASIC METABOLIC PANEL
Anion gap: 14 (ref 5–15)
BUN: 13 mg/dL (ref 6–20)
CO2: 23 mmol/L (ref 22–32)
Calcium: 8.8 mg/dL — ABNORMAL LOW (ref 8.9–10.3)
Chloride: 98 mmol/L (ref 98–111)
Creatinine, Ser: 1.05 mg/dL (ref 0.61–1.24)
GFR, Estimated: >60 mL/min (ref >60)
Glucose, Bld: 138 mg/dL — ABNORMAL HIGH (ref 70–99)
Potassium: 3.6 mmol/L (ref 3.5–5.1)
Sodium: 135 mmol/L (ref 135–145)

## 2022-09-09 LAB — LACTIC ACID, PLASMA: Lactic Acid, Venous: 1.9 mmol/L (ref 0.5–1.9)

## 2022-09-09 MED ORDER — LEVOFLOXACIN IN D5W 750 MG/150ML IV SOLN
750.0000 mg | Freq: Once | INTRAVENOUS | Status: AC
Start: 2022-09-09 — End: 2022-09-10
  Administered 2022-09-09: 750 mg via INTRAVENOUS
  Filled 2022-09-09: qty 150

## 2022-09-09 MED ORDER — ACETAMINOPHEN 325 MG PO TABS
650.0000 mg | ORAL_TABLET | Freq: Once | ORAL | Status: AC
  Administered 2022-09-09: 650 mg via ORAL
  Filled 2022-09-09: qty 2

## 2022-09-09 NOTE — ED Triage Notes (Addendum)
Pt presents to ED with c/o of SOB and being DX'ed with PNA 2 days ago. Pt states he was given z-pack and states "its not doing shit". Pt endorses fever. Pt is febrile in triage. Pt speaking in full sentences with no distress.   PO tylenol given in triage

## 2022-09-09 NOTE — ED Notes (Signed)
Reviewed pt with Dr Sidney Ace and further orders obtained

## 2022-09-09 NOTE — ED Notes (Signed)
PT brought to ed rm 7 at this time, this RN now assuming care.  

## 2022-09-09 NOTE — ED Notes (Signed)
Reviewed pts with Dr Modesto Charon, further orders obtained and entered by MD

## 2022-09-09 NOTE — ED Notes (Signed)
Patient transported to CT 

## 2022-09-09 NOTE — ED Notes (Signed)
ED Provider at bedside. 

## 2022-09-09 NOTE — ED Provider Notes (Signed)
[facility] Provider Note    Event Date/Time   First MD Initiated Contact with Patient 09/09/22 2304     (approximate)  History   Chief Complaint: Shortness of Breath  HPI  Francis Sanchez is a 42 y.o. male with a past medical history of gastric reflux, states recurrent pneumonia in the past who presents to the emergency department for continued cough congestion and fever.  According to the patient for the past several days he has had cough and fever.  Went to urgent care 2 days ago and was diagnosed with pneumonia and was started on a Z-Pak.  Patient states this is the second day of taking the antibiotic but he continued to have fever so he came to the emergency department.  Patient did have a fever upon arrival 102.1 was given Tylenol, currently 97.9.  Patient maintaining sats in the upper 90s.  Normal respiratory rate.  Does have occasional cough during exam.  Physical Exam   Triage Vital Signs: ED Triage Vitals  Enc Vitals Group     BP 09/09/22 1848 136/86     Pulse Rate 09/09/22 1845 89     Resp 09/09/22 1845 20     Temp 09/09/22 1845 (!) 102.1 F (38.9 C)     Temp Source 09/09/22 1845 Oral     SpO2 09/09/22 1845 97 %     Weight 09/09/22 1856 244 lb (110.7 kg)     Height 09/09/22 1856 5\' 10"  (1.778 m)     Head Circumference --      Peak Flow --      Pain Score 09/09/22 1845 7     Pain Loc --      Pain Edu? --      Excl. in [place]? --     Most recent vital signs: Vitals:   09/09/22 1848 09/09/22 2307  BP: 136/86 125/83  Pulse:  80  Resp:  20  Temp:  97.9 F (36.6 C)  SpO2:  97%    General: Awake, no distress.  CV:  Good peripheral perfusion.  Regular rate and rhythm  Resp:  Normal effort.  Equal breath sounds bilaterally.  No wheeze rales or rhonchi.  Occasional cough during exam. Abd:  No distention.  Soft, nontender.  No rebound or guarding.  ED Results / Procedures / Treatments   EKG  EKG viewed and interpreted by myself shows  normal sinus rhythm at 98 bpm with a narrow QRS, normal axis, normal intervals, no concerning ST changes.  RADIOLOGY  I have reviewed and interpreted chest x-ray images.  No significant consolidation seen on my evaluation. Radiology is read patchy airspace disease in the right base compatible with pneumonia.   MEDICATIONS ORDERED IN ED: Medications  levofloxacin (LEVAQUIN) IVPB 750 mg (750 mg Intravenous New Bag/Given 09/09/22 2329)  acetaminophen (TYLENOL) tablet 650 mg (650 mg Oral Given 09/09/22 1850)     IMPRESSION / MDM / ASSESSMENT AND PLAN / ED COURSE  I reviewed the triage vital signs and the nursing notes.  Patient's presentation is most consistent with acute presentation with potential threat to life or bodily function.  Patient presents to the emergency department for cough and fever.  States he has been on antibiotics for little over 24 hours continued to have fever as high as 102 tonight.  Down to 97.9 after Tylenol.  Patient states that history of recurrent pneumonia in the past.  X-ray does show right lower lobe possible pneumonia.  Lab work  shows a normal CBC with normal white blood cell count, reassuring chemistry, urinalysis is normal.  Blood cultures have been sent.  Lactic acid borderline elevated at 1.9.  Given the x-ray findings with history of recurrent pneumonia per patient we will proceed with a CT scan of the chest.  CT confirms bronchopneumonia in the right lower lobe.  Patient receiving IV Levaquin.  Reassuringly patient's oxygen level is normal.  Will discuss admission versus discharge.  Patient agreeable.  Patient now satting 91 to 92% on room air.  States last time he felt like this he was admitted for over a week.  States he is feeling worse and not better after going home and starting Zithromax.  Will admit to the hospital service for IV antibiotics.  Patient agreeable to plan of care.  FINAL CLINICAL IMPRESSION(S) / ED DIAGNOSES   Right lower lobe  pneumonia   Note:  This document was prepared using Dragon voice recognition software and may include unintentional dictation errors.   Minna Antis, MD 09/09/22 2355

## 2022-09-09 NOTE — ED Notes (Signed)
Lactic and 1 set of BC sent to lab as well as blue and red top

## 2022-09-10 ENCOUNTER — Inpatient Hospital Stay: Payer: Medicaid Other

## 2022-09-10 ENCOUNTER — Encounter: Payer: Self-pay | Admitting: Internal Medicine

## 2022-09-10 DIAGNOSIS — Z8701 Personal history of pneumonia (recurrent): Secondary | ICD-10-CM | POA: Diagnosis not present

## 2022-09-10 DIAGNOSIS — J690 Pneumonitis due to inhalation of food and vomit: Secondary | ICD-10-CM | POA: Diagnosis present

## 2022-09-10 DIAGNOSIS — K209 Esophagitis, unspecified without bleeding: Secondary | ICD-10-CM | POA: Diagnosis not present

## 2022-09-10 DIAGNOSIS — Z9109 Other allergy status, other than to drugs and biological substances: Secondary | ICD-10-CM | POA: Diagnosis not present

## 2022-09-10 DIAGNOSIS — K2100 Gastro-esophageal reflux disease with esophagitis, without bleeding: Secondary | ICD-10-CM | POA: Diagnosis present

## 2022-09-10 DIAGNOSIS — R739 Hyperglycemia, unspecified: Secondary | ICD-10-CM | POA: Diagnosis present

## 2022-09-10 DIAGNOSIS — Z91013 Allergy to seafood: Secondary | ICD-10-CM | POA: Diagnosis not present

## 2022-09-10 DIAGNOSIS — J189 Pneumonia, unspecified organism: Secondary | ICD-10-CM | POA: Diagnosis present

## 2022-09-10 DIAGNOSIS — R1310 Dysphagia, unspecified: Secondary | ICD-10-CM | POA: Diagnosis present

## 2022-09-10 DIAGNOSIS — A419 Sepsis, unspecified organism: Secondary | ICD-10-CM | POA: Diagnosis present

## 2022-09-10 DIAGNOSIS — Z91030 Bee allergy status: Secondary | ICD-10-CM | POA: Diagnosis not present

## 2022-09-10 LAB — HEPATIC FUNCTION PANEL
ALT: 27 U/L (ref 0–44)
AST: 27 U/L (ref 15–41)
Albumin: 4.4 g/dL (ref 3.5–5.0)
Alkaline Phosphatase: 42 U/L (ref 38–126)
Bilirubin, Direct: <0.1 mg/dL (ref 0.0–0.2)
Total Bilirubin: 0.8 mg/dL (ref 0.3–1.2)
Total Protein: 8 g/dL (ref 6.5–8.1)

## 2022-09-10 LAB — ETHANOL: Alcohol, Ethyl (B): <10 mg/dL (ref <10)

## 2022-09-10 LAB — HEMOGLOBIN A1C
Hgb A1c MFr Bld: 6 % — ABNORMAL HIGH (ref 4.8–5.6)
Mean Plasma Glucose: 125.5 mg/dL

## 2022-09-10 LAB — URINE DRUG SCREEN, QUALITATIVE (ARMC ONLY)
Amphetamines, Ur Screen: NOT DETECTED
Barbiturates, Ur Screen: NOT DETECTED
Benzodiazepine, Ur Scrn: NOT DETECTED
Cannabinoid 50 Ng, Ur ~~LOC~~: POSITIVE — AB
Cocaine Metabolite,Ur ~~LOC~~: NOT DETECTED
MDMA (Ecstasy)Ur Screen: NOT DETECTED
Methadone Scn, Ur: NOT DETECTED
Opiate, Ur Screen: NOT DETECTED
Phencyclidine (PCP) Ur S: NOT DETECTED
Tricyclic, Ur Screen: NOT DETECTED

## 2022-09-10 LAB — BLOOD GAS, VENOUS
O2 Saturation: 45.4 %
pO2, Ven: <31 mmHg — CL (ref 32–45)

## 2022-09-10 LAB — STREP PNEUMONIAE URINARY ANTIGEN: Strep Pneumo Urinary Antigen: NEGATIVE

## 2022-09-10 LAB — HIV ANTIBODY (ROUTINE TESTING W REFLEX): HIV Screen 4th Generation wRfx: NONREACTIVE

## 2022-09-10 LAB — PROCALCITONIN: Procalcitonin: <0.1 ng/mL

## 2022-09-10 LAB — MAGNESIUM: Magnesium: 1.9 mg/dL (ref 1.7–2.4)

## 2022-09-10 LAB — D-DIMER, QUANTITATIVE: D-Dimer, Quant: 0.71 ug/mL-FEU — ABNORMAL HIGH (ref 0.00–0.50)

## 2022-09-10 MED ORDER — SODIUM CHLORIDE 0.9 % IV SOLN: INTRAVENOUS | Status: DC

## 2022-09-10 MED ORDER — AZITHROMYCIN 500 MG PO TABS
500.0000 mg | ORAL_TABLET | Freq: Every day | ORAL | Status: DC
  Administered 2022-09-10: 500 mg via ORAL
  Filled 2022-09-10: qty 1

## 2022-09-10 MED ORDER — SODIUM CHLORIDE 0.9 % IV SOLN
12.5000 mg | Freq: Once | INTRAVENOUS | Status: AC
  Administered 2022-09-10: 12.5 mg via INTRAVENOUS
  Filled 2022-09-10: qty 12.5

## 2022-09-10 MED ORDER — ACETAMINOPHEN 325 MG PO TABS
650.0000 mg | ORAL_TABLET | Freq: Four times a day (QID) | ORAL | Status: DC | PRN
  Administered 2022-09-10 (×2): 650 mg via ORAL
  Filled 2022-09-10 (×2): qty 2

## 2022-09-10 MED ORDER — GUAIFENESIN ER 600 MG PO TB12: 600.0000 mg | ORAL_TABLET | Freq: Two times a day (BID) | ORAL | Status: DC | PRN

## 2022-09-10 MED ORDER — SODIUM CHLORIDE 0.9 % IV SOLN
2.0000 g | INTRAVENOUS | Status: DC
  Administered 2022-09-10: 2 g via INTRAVENOUS
  Filled 2022-09-10: qty 20

## 2022-09-10 MED ORDER — HEPARIN SODIUM (PORCINE) 5000 UNIT/ML IJ SOLN
5000.0000 [IU] | Freq: Three times a day (TID) | INTRAMUSCULAR | Status: DC
  Administered 2022-09-10 – 2022-09-11 (×4): 5000 [IU] via SUBCUTANEOUS
  Filled 2022-09-10 (×5): qty 1

## 2022-09-10 MED ORDER — PROMETHAZINE HCL 6.25 MG/5ML PO SOLN: 12.5000 mg | Freq: Once | ORAL | Status: DC

## 2022-09-10 MED ORDER — SUCRALFATE 1 G PO TABS
1.0000 g | ORAL_TABLET | Freq: Three times a day (TID) | ORAL | Status: DC
  Administered 2022-09-10 – 2022-09-11 (×5): 1 g via ORAL
  Filled 2022-09-10 (×5): qty 1

## 2022-09-10 MED ORDER — PANTOPRAZOLE SODIUM 40 MG IV SOLR
40.0000 mg | Freq: Two times a day (BID) | INTRAVENOUS | Status: DC
  Administered 2022-09-10 – 2022-09-11 (×4): 40 mg via INTRAVENOUS
  Filled 2022-09-10 (×4): qty 10

## 2022-09-10 MED ORDER — ALBUTEROL SULFATE (2.5 MG/3ML) 0.083% IN NEBU: 2.5000 mg | INHALATION_SOLUTION | RESPIRATORY_TRACT | Status: DC | PRN

## 2022-09-10 MED ORDER — IOHEXOL 350 MG/ML SOLN
75.0000 mL | Freq: Once | INTRAVENOUS | Status: AC | PRN
  Administered 2022-09-10: 75 mL via INTRAVENOUS

## 2022-09-10 NOTE — Assessment & Plan Note (Signed)
Will add A1c.

## 2022-09-10 NOTE — ED Notes (Signed)
Dr. Patel at bedside 

## 2022-09-10 NOTE — Hospital Course (Signed)
Younger PNA.

## 2022-09-10 NOTE — Assessment & Plan Note (Signed)
Chest CT noncontrast showing pneumonia. Patient and started on Levaquin. Will continue the same. As needed albuterol and oxygen checks with vitals. Pep Therapy as needed. D-dimer is pending.

## 2022-09-10 NOTE — ED Notes (Signed)
Patient transported back from CT 

## 2022-09-10 NOTE — H&P (Signed)
History and Physical     Patient: Francis Sanchez KGM:[id number] DOB: 02/22/1981 DOA: 09/09/2022 DOS: the patient was seen and examined on 09/10/2022 PCP: Patient, No Pcp Per   Patient coming from: Home  Chief Complaint: Shortness of breath  HISTORY OF PRESENT ILLNESS: Francis Sanchez is an 42 y.o. male complains of shortness of breath congestion. Patient diagnosed with pneumonia to 3 days ago and started on azithromycin coming back with fever Cough congestion and shortness of breath.pt presenting with  atypical presentation of PNA.  Past Medical History:  Diagnosis Date   GERD (gastroesophageal reflux disease)    Review of Systems  Constitutional:  Positive for chills, fatigue and fever.  Respiratory:  Positive for cough and shortness of breath.   Gastrointestinal:  Positive for vomiting.       NSAIDS +  All other systems reviewed and are negative.  Allergies  Allergen Reactions   Shellfish Allergy Swelling   Bee Venom Swelling   Other Swelling    Other reaction(s): Other (See Comments) Metal taste in mouth   Past Surgical History:  Procedure Laterality Date   ESOPHAGOGASTRODUODENOSCOPY (EGD) WITH PROPOFOL N/A 10/30/2019   Procedure: ESOPHAGOGASTRODUODENOSCOPY (EGD) WITH PROPOFOL;  Surgeon: Pasty Spillers, MD;  Location: [facility] ENDOSCOPY;  Service: Endoscopy;  Laterality: N/A;   HERNIA REPAIR     VASECTOMY  12/02/2020   wisdon teeth extraction     MEDICATIONS: Prior to Admission medications   Medication Sig Start Date End Date Taking? Authorizing Provider  butalbital-acetaminophen-caffeine (FIORICET) 50-325-40 MG tablet Take 1-2 tablets by mouth every 6 (six) hours as needed for headache. 01/08/22 01/08/23  Minna Antis, MD  oxymetazoline (AFRIN) 0.05 % nasal spray Place 2 sprays into both nostrils 2 (two) times daily as needed for congestion (sinus headache). 01/08/22 01/08/23  Minna Antis, MD    heparin  5,000 Units Subcutaneous Q8H   pantoprazole  (PROTONIX) IV  40 mg Intravenous Q12H   promethazine  12.5 mg Oral Once   sucralfate  1 g Oral TID WC & HS     sodium chloride    ED Course: Pt in Ed is alert awake oriented afebrile meeting sepsis criteria. Vitals:   09/09/22 1848 09/09/22 1856 09/09/22 2307 09/10/22 0045  BP: 136/86  125/83 127/87  Pulse:   80 83  Resp:   20 18  Temp:   97.9 F (36.6 C)   TempSrc:   Oral   SpO2:   97% 96%  Weight:  110.7 kg    Height:  5\' 10"  (1.778 m)     Total I/O In: 150 [IV Piggyback:150] Out: -  SpO2: 96 % Blood work in ed shows: 91% on RA.  BMP shows a glucose of 138 otherwise normal kidney function and electrolytes LFTs to be ordered. CBC shows a normal white count normal hemoglobin normal platelet. Resp panel for covid and flu negative at urgent care.  Pt has had egd in past showing esophagitis.  - LA Grade A reflux esophagitis with no bleeding. - A few gastric polyps. Biopsied. - Small hiatal hernia. - Mucosal changes in the duodenum. Biopsied.  Results for orders placed or performed during the hospital encounter of 09/09/22 (from the past 72 hour(s))  Basic metabolic panel     Status: Abnormal   Collection Time: 09/09/22  6:54 PM  Result Value Ref Range   Sodium 135 135 - 145 mmol/L   Potassium 3.6 3.5 - 5.1 mmol/L   Chloride 98 98 - 111 mmol/L  CO2 23 22 - 32 mmol/L   Glucose, Bld 138 (H) 70 - 99 mg/dL    Comment: Glucose reference range applies only to samples taken after fasting for at least 8 hours.   BUN 13 6 - 20 mg/dL   Creatinine, Ser 0.98 0.61 - 1.24 mg/dL   Calcium 8.8 (L) 8.9 - 10.3 mg/dL   GFR, Estimated >11 >91 mL/min    Comment: (NOTE) Calculated using the CKD-EPI Creatinine Equation (2021)    Anion gap 14 5 - 15    Comment: Performed at [facility], [address] [place], [place] [address]  CBC     Status: None   Collection Time: 09/09/22  6:54 PM  Result Value Ref Range   WBC 7.3 4.0 - 10.5 K/uL   RBC 5.21 4.22 - 5.81 MIL/uL   Hemoglobin  15.0 13.0 - 17.0 g/dL   HCT 56.2 13.0 - 86.5 %   MCV 89.1 80.0 - 100.0 fL   MCH 28.8 26.0 - 34.0 pg   MCHC 32.3 30.0 - 36.0 g/dL   RDW 78.4 69.6 - 29.5 %   Platelets 355 150 - 400 K/uL   nRBC 0.0 0.0 - 0.2 %    Comment: Performed at [facility], [address] [place], [place] [address]  Urinalysis, Routine w reflex microscopic -Urine, Clean Catch     Status: Abnormal   Collection Time: 09/09/22  6:54 PM  Result Value Ref Range   Color, Urine YELLOW (A) YELLOW   APPearance CLEAR (A) CLEAR   Specific Gravity, Urine 1.010 1.005 - 1.030   pH 6.0 5.0 - 8.0   Glucose, UA NEGATIVE NEGATIVE mg/dL   Hgb urine dipstick SMALL (A) NEGATIVE   Bilirubin Urine NEGATIVE NEGATIVE   Ketones, ur NEGATIVE NEGATIVE mg/dL   Protein, ur NEGATIVE NEGATIVE mg/dL   Nitrite NEGATIVE NEGATIVE   Leukocytes,Ua NEGATIVE NEGATIVE   RBC / HPF 0-5 0 - 5 RBC/hpf   WBC, UA 0-5 0 - 5 WBC/hpf   Bacteria, UA NONE SEEN NONE SEEN   Squamous Epithelial / HPF 0-5 0 - 5 /HPF    Comment: Performed at [facility], [address] [place], [place] [address]  Lactic acid, plasma     Status: None   Collection Time: 09/09/22  6:54 PM  Result Value Ref Range   Lactic Acid, Venous 1.9 0.5 - 1.9 mmol/L    Comment: Performed at [facility], [address] [place], [place] [address]  Hepatic function panel     Status: None   Collection Time: 09/09/22  6:54 PM  Result Value Ref Range   Total Protein 8.0 6.5 - 8.1 g/dL   Albumin 4.4 3.5 - 5.0 g/dL   AST 27 15 - 41 U/L   ALT 27 0 - 44 U/L   Alkaline Phosphatase 42 38 - 126 U/L   Total Bilirubin 0.8 0.3 - 1.2 mg/dL   Bilirubin, Direct <3.6 0.0 - 0.2 mg/dL   Indirect Bilirubin NOT CALCULATED 0.3 - 0.9 mg/dL    Comment: Performed at [facility], [address] [place], [place] [address]  D-dimer, quantitative     Status: Abnormal   Collection Time: 09/09/22  6:54 PM  Result Value Ref Range   D-Dimer, Quant 0.71 (H) 0.00 - 0.50  ug/mL-FEU    Comment: (NOTE) At the manufacturer cut-off value of 0.5 g/mL FEU, this assay has a negative predictive value of 95-100%.This assay is intended for use in conjunction  with a clinical pretest probability (PTP) assessment model to exclude pulmonary embolism (PE) and deep venous thrombosis (DVT) in outpatients suspected of PE or DVT. Results should be correlated with clinical presentation. Performed at [facility], [address] [place], [place] [address]     Lab Results  Component Value Date   CREATININE 1.05 09/09/2022   CREATININE 0.92 01/08/2022   CREATININE 1.16 10/15/2020      Latest Ref Rng & Units 09/09/2022    6:54 PM 01/08/2022    8:35 AM 10/15/2020    4:20 PM  CMP  Glucose 70 - 99 mg/dL 604  540  98   BUN 6 - 20 mg/dL 13  17  20    Creatinine 0.61 - 1.24 mg/dL 9.81  1.91  4.78   Sodium 135 - 145 mmol/L 135  138  140   Potassium 3.5 - 5.1 mmol/L 3.6  4.4  4.3   Chloride 98 - 111 mmol/L 98  102  100   CO2 22 - 32 mmol/L 23  28  30    Calcium 8.9 - 10.3 mg/dL 8.8  9.5  9.4   Total Protein 6.5 - 8.1 g/dL 8.0  8.3    Total Bilirubin 0.3 - 1.2 mg/dL 0.8  0.6    Alkaline Phos 38 - 126 U/L 42  58    AST 15 - 41 U/L 27  25    ALT 0 - 44 U/L 27  31     Unresulted Labs (From admission, onward)     Start     Ordered   09/10/22 0103  Blood gas, venous  Once,   R        09/10/22 0102   09/10/22 0043  HIV Antibody (routine testing w rflx)  (HIV Antibody (Routine testing w reflex) panel)  Once,   R        09/10/22 0044   09/10/22 0043  Legionella Pneumophila Serogp 1 Ur Ag  (COPD / Pneumonia / Cellulitis / Lower Extremity Wound)  Once,   R        09/10/22 0044   09/10/22 0043  Strep pneumoniae urinary antigen  (COPD / Pneumonia / Cellulitis / Lower Extremity Wound)  Once,   R        09/10/22 0044   09/10/22 0040  Magnesium  Add-on,   AD        09/10/22 0039   09/10/22 0040  Ethanol  Add-on,   AD        09/10/22 0039   09/10/22 0033  Urine Drug Screen,  Qualitative ([facility] only)  Add-on,   AD        09/10/22 0032   09/10/22 0021  Hemoglobin A1c  Add-on,   AD        09/10/22 0020   09/10/22 0018  MRSA culture  Once,   URGENT        09/10/22 0017   09/10/22 0017  Procalcitonin  Add-on,   AD       References:    Procalcitonin Lower Respiratory Tract Infection AND Sepsis Procalcitonin Algorithm   09/10/22 0016   09/09/22 1854  Blood culture (routine x 2)  BLOOD CULTURE X 2,   STAT      09/09/22 1853           Pt has received : Orders Placed This Encounter  Procedures   Blood culture (routine x 2)    Standing Status:   Standing    Number of  Occurrences:   2   MRSA culture    Standing Status:   Standing    Number of Occurrences:   1   DG Chest 2 View    Standing Status:   Standing    Number of Occurrences:   1    Order Specific Question:   Symptom/Reason for Exam    Answer:   Shortness of breath [786.05.ICD-9-CM]   CT CHEST WO CONTRAST    Standing Status:   Standing    Number of Occurrences:   1   Basic metabolic panel    Standing Status:   Standing    Number of Occurrences:   1   CBC    Standing Status:   Standing    Number of Occurrences:   1   Urinalysis, Routine w reflex microscopic -Urine, Clean Catch    Standing Status:   Standing    Number of Occurrences:   1    Order Specific Question:   Specimen Source    Answer:   Urine, Clean Catch [76]   Hepatic function panel    Standing Status:   Standing    Number of Occurrences:   1   D-dimer, quantitative    Standing Status:   Standing    Number of Occurrences:   1   Procalcitonin    Standing Status:   Standing    Number of Occurrences:   1   Hemoglobin A1c    Standing Status:   Standing    Number of Occurrences:   1   Urine Drug Screen, Qualitative ([facility] only)    Standing Status:   Standing    Number of Occurrences:   1   Magnesium    Standing Status:   Standing    Number of Occurrences:   1   Ethanol    Standing Status:   Standing    Number of Occurrences:    1   HIV Antibody (routine testing w rflx)    Standing Status:   Standing    Number of Occurrences:   1   Legionella Pneumophila Serogp 1 Ur Ag    Standing Status:   Standing    Number of Occurrences:   1   Strep pneumoniae urinary antigen    Standing Status:   Standing    Number of Occurrences:   1   Blood gas, venous    Standing Status:   Standing    Number of Occurrences:   1   Diet Heart Room service appropriate? Yes; Fluid consistency: Thin    Standing Status:   Standing    Number of Occurrences:   1    Order Specific Question:   Room service appropriate?    Answer:   Yes    Order Specific Question:   Fluid consistency:    Answer:   Thin   Document Height and Actual Weight    Use scales to weigh patient, not stated or estimated weight.    Standing Status:   Standing    Number of Occurrences:   1   Apply Pneumonia Care Plan    Standing Status:   Standing    Number of Occurrences:   1   Cardiac Monitoring Continuous x 24 hours Indications for use: Other; other indications for use: PNA    Standing Status:   Standing    Number of Occurrences:   1    Order Specific Question:   Indications for use:    Answer:  Other    Order Specific Question:   other indications for use:    Answer:   PNA   Vital signs    Standing Status:   Standing    Number of Occurrences:   1   Notify physician (specify)    Standing Status:   Standing    Number of Occurrences:   20    Order Specific Question:   Notify Physician    Answer:   for pulse less than 55 or greater than 120    Order Specific Question:   Notify Physician    Answer:   for respiratory rate less than 12 or greater than 25    Order Specific Question:   Notify Physician    Answer:   for temperature greater than 100.5 F    Order Specific Question:   Notify Physician    Answer:   for urinary output less than 30 mL/hr for four hours    Order Specific Question:   Notify Physician    Answer:   for systolic BP less than 90 or greater  than 160, diastolic BP less than 60 or greater than 100    Order Specific Question:   Notify Physician    Answer:   for new hypoxia w/ oxygen saturations < 88%   Progressive Mobility Protocol: No Restrictions    Standing Status:   Standing    Number of Occurrences:   1   If patient diabetic or glucose greater than 140 notify physician for Sliding Scale Insulin Orders    Standing Status:   Standing    Number of Occurrences:   20   Intake and Output    Standing Status:   Standing    Number of Occurrences:   1   Do not place and if present remove PureWick    Standing Status:   Standing    Number of Occurrences:   1   Initiate Oral Care Protocol    Standing Status:   Standing    Number of Occurrences:   1   Initiate Carrier Fluid Protocol    Standing Status:   Standing    Number of Occurrences:   1   RN may order General Admission PRN Orders utilizing "General Admission PRN medications" (through manage orders) for the following patient needs: allergy symptoms (Claritin), cold sores (Carmex), cough (Robitussin DM), eye irritation (Liquifilm Tears), hemorrhoids (Tucks), indigestion (Maalox), minor skin irritation (Hydrocortisone Cream), muscle pain (Ben Gay), nose irritation (saline nasal spray) and sore throat (Chloraseptic spray).    Standing Status:   Standing    Number of Occurrences:   [phone]   Full code    Standing Status:   Standing    Number of Occurrences:   1    Order Specific Question:   By:    Answer:   Other   Consult to hospitalist    Standing Status:   Standing    Number of Occurrences:   1    Order Specific Question:   Place call to:    Answer:   [facility]    Order Specific Question:   Reason for Consult    Answer:   Admit    Order Specific Question:   Diagnosis/Clinical Info for Consult:    Answer:   CAP   Consult to respiratory care treatment (RT)    Standing Status:   Standing    Number of Occurrences:   1    Order Specific Question:   Reason for Consult?  Answer:    Evaluation   Pulse oximetry check with vital signs    Standing Status:   Standing    Number of Occurrences:   1   Oxygen therapy Mode or (Route): Nasal cannula; Liters Per Minute: 2; Keep 02 saturation: greater than 92 %    Standing Status:   Standing    Number of Occurrences:   1    Order Specific Question:   Mode or (Route)    Answer:   Nasal cannula    Order Specific Question:   Liters Per Minute    Answer:   2    Order Specific Question:   Keep 02 saturation    Answer:   greater than 92 %   Incentive spirometry    Standing Status:   Standing    Number of Occurrences:   1   ED EKG    Altered mental status    Standing Status:   Standing    Number of Occurrences:   1    Order Specific Question:   Reason for Exam    Answer:   Other (See Comments)   EKG 12-Lead    Standing Status:   Standing    Number of Occurrences:   1   Admit to Inpatient (patient's expected length of stay will be greater than 2 midnights or inpatient only procedure)    Standing Status:   Standing    Number of Occurrences:   1    Order Specific Question:   Hospital Area    Answer:   [facility] [[id number]]    Order Specific Question:   Level of Care    Answer:   Telemetry Medical [104]    Order Specific Question:   Covid Evaluation    Answer:   Asymptomatic - no recent exposure (last 10 days) testing not required    Order Specific Question:   Diagnosis    Answer:   Pneumonia [227785]    Order Specific Question:   Admitting Physician    Answer:   Darrold Junker    Order Specific Question:   Attending Physician    Answer:   Darrold Junker    Order Specific Question:   Certification:    Answer:   I certify this patient will need inpatient services for at least 2 midnights    Order Specific Question:   Estimated Length of Stay    Answer:   2   Aspiration precautions    Standing Status:   Standing    Number of Occurrences:   1    Meds ordered this encounter  Medications    acetaminophen (TYLENOL) tablet 650 mg   levofloxacin (LEVAQUIN) IVPB 750 mg    Order Specific Question:   Antibiotic Indication:    Answer:   CAP   0.9 %  sodium chloride infusion   albuterol (PROVENTIL) (2.5 MG/3ML) 0.083% nebulizer solution 2.5 mg   guaiFENesin (MUCINEX) 12 hr tablet 600 mg   heparin injection 5,000 Units   pantoprazole (PROTONIX) injection 40 mg   promethazine (PHENERGAN) 6.25 MG/5ML solution 12.5 mg   sucralfate (CARAFATE) tablet 1 g    EKG shows:  Admission Imaging : CT CHEST WO CONTRAST  Result Date: 09/09/2022 CLINICAL DATA:  Shortness of breath. Diagnosed with pneumonia 2 days ago. EXAM: CT CHEST WITHOUT CONTRAST TECHNIQUE: Multidetector CT imaging of the chest was performed following the standard protocol without IV contrast. RADIATION DOSE REDUCTION: This exam was performed according  to the departmental dose-optimization program which includes automated exposure control, adjustment of the mA and/or kV according to patient size and/or use of iterative reconstruction technique. COMPARISON:  Chest radiograph 09/09/2022 FINDINGS: Cardiovascular: No significant vascular findings. Normal heart size. No pericardial effusion. Mediastinum/Nodes: Small hiatal hernia. Esophagus is otherwise unremarkable. Prominent subcentimeter mediastinal and hilar nodes are favored reactive. Lungs/Pleura: Scattered centrilobular nodularity in both lungs greatest in the right lower lobe compatible with bronchopneumonia. No pleural effusion or pneumothorax. Upper Abdomen: No acute abnormality. Musculoskeletal: No acute fracture. IMPRESSION: Bronchopneumonia greatest in the right lower lobe. Follow-up in 6-8 weeks is recommended to ensure resolution. Electronically Signed   By: Minerva Fester M.D.   On: 09/09/2022 23:40   DG Chest 2 View  Result Date: 09/09/2022 CLINICAL DATA:  Shortness of breath. EXAM: CHEST - 2 VIEW COMPARISON:  09/25/2019 FINDINGS: Patchy airspace disease is seen at the  right lung base. Left lung clear. No edema or pleural effusion. The cardiopericardial silhouette is within normal limits for size. No acute bony abnormality. IMPRESSION: Patchy airspace disease at the right base compatible with pneumonia. Electronically Signed   By: Kennith Center M.D.   On: 09/09/2022 20:30   Physical Examination: Vitals:   09/09/22 1848 09/09/22 1856 09/09/22 2307 09/10/22 0045  BP: 136/86  125/83 127/87  Pulse:   80 83  Temp:   97.9 F (36.6 C)   Resp:   20 18  Height:  5\' 10"  (1.778 m)    Weight:  110.7 kg    SpO2:   97% 96%  TempSrc:   Oral   BMI (Calculated):  35.01     Physical Exam Vitals and nursing note reviewed.  Constitutional:      General: He is not in acute distress.    Appearance: He is not ill-appearing, toxic-appearing or diaphoretic.  HENT:     Head: Normocephalic and atraumatic.     Right Ear: Hearing and external ear normal.     Left Ear: Hearing and external ear normal.     Nose: Nose normal. No nasal deformity.     Mouth/Throat:     Lips: Pink.     Mouth: Mucous membranes are moist.     Tongue: No lesions.     Pharynx: Oropharynx is clear.  Eyes:     Extraocular Movements: Extraocular movements intact.     Pupils: Pupils are equal, round, and reactive to light.  Cardiovascular:     Rate and Rhythm: Normal rate and regular rhythm.     Pulses: Normal pulses.     Heart sounds: Normal heart sounds.  Pulmonary:     Effort: Pulmonary effort is normal.     Breath sounds: Examination of the right-upper field reveals rhonchi. Examination of the left-upper field reveals rhonchi. Examination of the right-middle field reveals rhonchi. Examination of the left-middle field reveals rhonchi. Examination of the right-lower field reveals rhonchi. Examination of the left-lower field reveals rhonchi. Rhonchi present.  Abdominal:     General: Bowel sounds are normal. There is no distension.     Palpations: Abdomen is soft. There is no mass.     Tenderness:  There is no abdominal tenderness. There is no guarding.     Hernia: No hernia is present.  Musculoskeletal:     Right lower leg: No edema.     Left lower leg: No edema.  Skin:    General: Skin is warm.  Neurological:     General: No focal deficit present.  Mental Status: He is alert and oriented to person, place, and time.     Cranial Nerves: Cranial nerves 2-12 are intact.     Motor: Motor function is intact.  Psychiatric:        Attention and Perception: Attention normal.        Mood and Affect: Mood normal.        Speech: Speech normal.        Behavior: Behavior normal. Behavior is cooperative.        Cognition and Memory: Cognition normal.     Assessment and Plan: * Aspiration pneumonia (HCC) Suspect aspiration due to underlying GI issue with esophagitis and dysphagia.  Patient has atypical presentation with fever chills cough congestion with no white count. Also on exam he has diffuse rhonchi much is localized to the right side. Discussed with patient about the need to identify if this is an actual infectious process or an inflammatory, autoimmune. 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 Also advised patient to follow-up again with GI as this could be GERD related. Patient advised to refrain from using NSAIDs. May need gi eval again and or outpatient follow up. Cont levaquin.  Follow cultures. CT is noncontrast will obtain CTA and eval for PE as well if needed. D-dimer, procalcitonin.  Blood glucose elevated Will add A1c.   Sepsis due to pneumonia [facility]) Chest CT noncontrast showing pneumonia. Patient and started on Levaquin. Will continue the same. As needed albuterol and oxygen checks with vitals. Pep Therapy as needed. D-dimer is pending.   Esophagitis I V PPI.    DVT prophylaxis:  Heparin.  Code Status:  Full code.     01/08/2022    9:37  AM  Advanced Directives  Does Patient Have a Medical Advance Directive? No  Would patient like information on creating a medical advance directive? No - Patient declined   Family Communication:  None Emergency Contact: Contact Information     Name Relation Home Work Mobile   Eugenio Saenz Spouse   [phone]   Dhue,Cindy Mother [phone]        Disposition Plan:  Home Consults: None Admission status: Observation Unit / Expected LOS: Med/tele/ 2 days   Gertha Calkin MD [facility]  6 PM- 2 AM. [phone]( Pager )  For questions regarding this patient please use [email or web address] to contact the current [facility] MD.   Bonita Quin may also call [phone] to contact current Assigned [facility] Attending/Consulting MD for this patient.

## 2022-09-10 NOTE — ED Notes (Signed)
Patient transported to CT 

## 2022-09-10 NOTE — Assessment & Plan Note (Signed)
IV PPI. 

## 2022-09-10 NOTE — Final Progress Note (Signed)
Same day rounding progress note  Patient seen and examined.  He is feeling better.  Please see Dr. Eliane Decree dictated history and physical for further details.  I agree with her assessment and plan.  Multi lobar pneumonia confirmed on CT scan of the chest Switch antibiotic to IV Rocephin and Zithromax. Procalcitonin less than 0.1 and lactic acid 1.9  Time spent 15 minutes

## 2022-09-10 NOTE — Assessment & Plan Note (Addendum)
Suspect aspiration due to underlying GI issue with esophagitis and dysphagia.  Patient has atypical presentation with fever chills cough congestion with no white count. Also on exam he has diffuse rhonchi much is localized to the right side. Discussed with patient about the need to identify if this is an actual infectious process or an inflammatory, autoimmune. 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 Also advised patient to follow-up again with GI as this could be GERD related. Patient advised to refrain from using NSAIDs. May need gi eval again and or outpatient follow up. Cont levaquin.  Follow cultures. CT is noncontrast will obtain CTA and eval for PE as well if needed. D-dimer, procalcitonin.

## 2022-09-11 DIAGNOSIS — R739 Hyperglycemia, unspecified: Secondary | ICD-10-CM

## 2022-09-11 DIAGNOSIS — K209 Esophagitis, unspecified without bleeding: Secondary | ICD-10-CM | POA: Diagnosis not present

## 2022-09-11 DIAGNOSIS — J189 Pneumonia, unspecified organism: Secondary | ICD-10-CM | POA: Diagnosis not present

## 2022-09-11 LAB — CBC
HCT: 43.7 % (ref 39.0–52.0)
Hemoglobin: 14.2 g/dL (ref 13.0–17.0)
MCH: 28.9 pg (ref 26.0–34.0)
MCHC: 32.5 g/dL (ref 30.0–36.0)
MCV: 89 fL (ref 80.0–100.0)
Platelets: 306 10*3/uL (ref 150–400)
RBC: 4.91 MIL/uL (ref 4.22–5.81)
RDW: 13.5 % (ref 11.5–15.5)
WBC: 4.6 10*3/uL (ref 4.0–10.5)
nRBC: 0 % (ref 0.0–0.2)

## 2022-09-11 LAB — MRSA CULTURE

## 2022-09-11 LAB — BASIC METABOLIC PANEL
Anion gap: 6 (ref 5–15)
BUN: 13 mg/dL (ref 6–20)
CO2: 27 mmol/L (ref 22–32)
Calcium: 8.3 mg/dL — ABNORMAL LOW (ref 8.9–10.3)
Chloride: 106 mmol/L (ref 98–111)
Creatinine, Ser: 0.94 mg/dL (ref 0.61–1.24)
GFR, Estimated: >60 mL/min (ref >60)
Glucose, Bld: 110 mg/dL — ABNORMAL HIGH (ref 70–99)
Potassium: 3.9 mmol/L (ref 3.5–5.1)
Sodium: 139 mmol/L (ref 135–145)

## 2022-09-11 LAB — LEGIONELLA PNEUMOPHILA SEROGP 1 UR AG: L. pneumophila Serogp 1 Ur Ag: NEGATIVE

## 2022-09-11 MED ORDER — GUAIFENESIN ER 600 MG PO TB12: 600.0000 mg | ORAL_TABLET | Freq: Two times a day (BID) | ORAL | 0 refills | Status: AC | PRN

## 2022-09-11 MED ORDER — AMOXICILLIN-POT CLAVULANATE 500-125 MG PO TABS: 1.0000 | ORAL_TABLET | Freq: Three times a day (TID) | ORAL | 0 refills | Status: AC

## 2022-09-11 NOTE — Progress Notes (Signed)
Discharge instructions reviewed with patient including followup visits and new medications/medication changes.  Understanding was verbalized and all questions were answered.  IV removed without complication; patient tolerated well.  Patient discharged home via wheelchair in stable condition escorted by volunteer staff.  

## 2022-09-12 NOTE — Discharge Summary (Signed)
Physician Discharge Summary   Patient: Francis Sanchez MRN: [id number] DOB: [birth year]  Admit date:     09/09/2022  Discharge date: 09/11/2022  Discharge Physician: Delfino Lovett   PCP: Patient, No Pcp Per   Recommendations at discharge:   Follow-up with outpatient providers as requested  Discharge Diagnoses: Principal Problem:   Aspiration pneumonia (HCC) Active Problems:   Esophagitis   Sepsis due to pneumonia (HCC)   Blood glucose elevated   Pneumonia  Hospital Course: Assessment and Plan: *Community-acquired pneumonia (HCC)  Patient has atypical presentation with fever chills cough congestion with no white count. He had a significant improvement with IV antibiotics while in the hospital.  His procalcitonin was less than 0.1 and lactic acid 1.9 CT is noncontrast will obtain CTA and eval for PE as well if needed.  CT chest was negative for PE and showed multilobar pneumonia He was feeling much better and wanted to go home so discharged on oral antibiotic as he was back to baseline  Sepsis ruled out Esophagitis Continue PPI  Urine drug screen positive for cannabinoid       Disposition: Home Diet recommendation:  Discharge Diet Orders (From admission, onward)     Start     Ordered   09/11/22 0000  Diet - low sodium heart healthy        09/11/22 0752           Carb modified diet DISCHARGE MEDICATION: Allergies as of 09/11/2022       Reactions   Shellfish Allergy Swelling   Bee Venom Swelling   Other Swelling   Other reaction(s): Other (See Comments) Metal taste in mouth        Medication List     STOP taking these medications    azithromycin 500 MG tablet Commonly known as: ZITHROMAX   predniSONE 20 MG tablet Commonly known as: DELTASONE       TAKE these medications    albuterol 108 (90 Base) MCG/ACT inhaler Commonly known as: VENTOLIN HFA Inhale 1-2 puffs into the lungs every 6 (six) hours as needed.   amoxicillin-clavulanate 500-125  MG tablet Commonly known as: Augmentin Take 1 tablet by mouth 3 (three) times daily for 5 days.   benzonatate 200 MG capsule Commonly known as: TESSALON Take 200 mg by mouth 3 (three) times daily as needed.   guaiFENesin 600 MG 12 hr tablet Commonly known as: MUCINEX Take 1 tablet (600 mg total) by mouth 2 (two) times daily as needed for up to 7 days for cough.        Follow-up Information     Barbette Reichmann, MD. Schedule an appointment as soon as possible for a visit in 1 week(s).   Specialty: Internal Medicine Why: Patient will make own follow up appt  [facility] Discharge F/UP Contact information: [address] [place] [address] [phone]                Discharge Exam: Francis Sanchez Weights   09/09/22 1856  Weight: 9.55 kg   42 year old male lying in the bed comfortably without any acute distress Lungs clear to auscultation bilaterally Heart regular rate and rhythm Abdomen soft, benign Neuro alert and awake, nonfocal Psych normal mood and affect  Condition at discharge: fair  The results of significant diagnostics from this hospitalization (including imaging, microbiology, ancillary and laboratory) are listed below for reference.   Imaging Studies: CT Angio Chest Pulmonary Embolism (PE) W or WO Contrast  Result Date: 09/10/2022  CLINICAL DATA:  Concern for pulmonary embolism. EXAM: CT ANGIOGRAPHY CHEST WITH CONTRAST TECHNIQUE: Multidetector CT imaging of the chest was performed using the standard protocol during bolus administration of intravenous contrast. Multiplanar CT image reconstructions and MIPs were obtained to evaluate the vascular anatomy. RADIATION DOSE REDUCTION: This exam was performed according to the departmental dose-optimization program which includes automated exposure control, adjustment of the mA and/or kV according to patient size and/or use of iterative reconstruction technique. CONTRAST:  75mL OMNIPAQUE  IOHEXOL 350 MG/ML SOLN COMPARISON:  Chest CT dated 09/09/2022. FINDINGS: Cardiovascular: There is no cardiomegaly or pericardial effusion. The thoracic aorta is unremarkable. Evaluation of the pulmonary arteries is limited due to respiratory motion and suboptimal opacification of the peripheral branches. No large or central pulmonary artery embolus identified. Mediastinum/Nodes: Right hilar adenopathy, reactive. The esophagus is grossly unremarkable. No mediastinal fluid collection. Lungs/Pleura: Scattered clusters of ground-glass nodular densities bilaterally, right greater left most consistent with pneumonia, likely atypical in etiology. No consolidative changes. There is no pleural effusion or pneumothorax. The central airways are patent. Upper Abdomen: Fatty liver. Musculoskeletal: No acute osseous pathology. Review of the MIP images confirms the above findings. IMPRESSION: 1. No CT evidence of central pulmonary artery embolus. 2. Multilobar pneumonia, likely atypical in etiology. 3. Fatty liver. Electronically Signed   By: Elgie Collard M.D.   On: 09/10/2022 02:47   CT CHEST WO CONTRAST  Result Date: 09/09/2022 CLINICAL DATA:  Shortness of breath. Diagnosed with pneumonia 2 days ago. EXAM: CT CHEST WITHOUT CONTRAST TECHNIQUE: Multidetector CT imaging of the chest was performed following the standard protocol without IV contrast. RADIATION DOSE REDUCTION: This exam was performed according to the departmental dose-optimization program which includes automated exposure control, adjustment of the mA and/or kV according to patient size and/or use of iterative reconstruction technique. COMPARISON:  Chest radiograph 09/09/2022 FINDINGS: Cardiovascular: No significant vascular findings. Normal heart size. No pericardial effusion. Mediastinum/Nodes: Small hiatal hernia. Esophagus is otherwise unremarkable. Prominent subcentimeter mediastinal and hilar nodes are favored reactive. Lungs/Pleura: Scattered  centrilobular nodularity in both lungs greatest in the right lower lobe compatible with bronchopneumonia. No pleural effusion or pneumothorax. Upper Abdomen: No acute abnormality. Musculoskeletal: No acute fracture. IMPRESSION: Bronchopneumonia greatest in the right lower lobe. Follow-up in 6-8 weeks is recommended to ensure resolution. Electronically Signed   By: Minerva Fester M.D.   On: 09/09/2022 23:40   DG Chest 2 View  Result Date: 09/09/2022 CLINICAL DATA:  Shortness of breath. EXAM: CHEST - 2 VIEW COMPARISON:  09/25/2019 FINDINGS: Patchy airspace disease is seen at the right lung base. Left lung clear. No edema or pleural effusion. The cardiopericardial silhouette is within normal limits for size. No acute bony abnormality. IMPRESSION: Patchy airspace disease at the right base compatible with pneumonia. Electronically Signed   By: Kennith Center M.D.   On: 09/09/2022 20:30    Microbiology: Results for orders placed or performed during the hospital encounter of 09/09/22  Blood culture (routine x 2)     Status: None (Preliminary result)   Collection Time: 09/09/22  6:53 PM   Specimen: BLOOD  Result Value Ref Range Status   Specimen Description BLOOD RIGHT [facility]  Final   Special Requests   Final    BOTTLES DRAWN AEROBIC AND ANAEROBIC Blood Culture adequate volume   Culture   Final    NO GROWTH 3 DAYS Performed at [facility], [address], [place], [place] [address]    Report Status PENDING  Incomplete  Blood culture (  routine x 2)     Status: None (Preliminary result)   Collection Time: 09/09/22 11:22 PM   Specimen: BLOOD  Result Value Ref Range Status   Specimen Description BLOOD  RIGHT ARM  Final   Special Requests   Final    BOTTLES DRAWN AEROBIC AND ANAEROBIC Blood Culture adequate volume   Culture   Final    NO GROWTH 3 DAYS Performed at [facility], [address], [place], [place] [address]    Report Status PENDING  Incomplete  MRSA culture     Status:  None   Collection Time: 09/10/22  1:43 AM   Specimen: Nasal Swab; Body Fluid  Result Value Ref Range Status   Specimen Description   Final    Nasal Swab Performed at [facility], [address], [place], [place] [address]    Special Requests   Final    NONE Performed at [facility], [address] [place], [place] [address]    Culture   Final    NO STAPHYLOCOCCUS AUREUS ISOLATED Performed at [facility], [address] [place], [place] [address]    Report Status 09/11/2022 FINAL  Final    Labs: CBC: Recent Labs  Lab 09/09/22 1854 09/11/22 0615  WBC 7.3 4.6  HGB 15.0 14.2  HCT 46.4 43.7  MCV 89.1 89.0  PLT 355 306   Basic Metabolic Panel: Recent Labs  Lab 09/09/22 1854 09/11/22 0615  NA 135 139  K 3.6 3.9  CL 98 106  CO2 23 27  GLUCOSE 138* 110*  BUN 13 13  CREATININE 1.05 0.94  CALCIUM 8.8* 8.3*  MG 1.9  --    Liver Function Tests: Recent Labs  Lab 09/09/22 1854  AST 27  ALT 27  ALKPHOS 42  BILITOT 0.8  PROT 8.0  ALBUMIN 4.4   CBG: No results for input(s): "GLUCAP" in the last 168 hours.  Discharge time spent: greater than 30 minutes.  Signed: Delfino Lovett, MD [facility] 09/12/2022

## 2022-09-13 LAB — BLOOD GAS, VENOUS
Acid-Base Excess: 5.8 mmol/L — ABNORMAL HIGH (ref 0.0–2.0)
O2 Saturation: 45.4 %
pCO2, Ven: 50 mmHg (ref 44–60)
pH, Ven: 7.41 (ref 7.25–7.43)

## 2022-09-14 LAB — CULTURE, BLOOD (ROUTINE X 2)
Culture: NO GROWTH
Culture: NO GROWTH
Special Requests: ADEQUATE
Special Requests: ADEQUATE

## 2023-05-31 ENCOUNTER — Other Ambulatory Visit: Payer: Self-pay

## 2023-05-31 ENCOUNTER — Emergency Department
Admission: EM | Admit: 2023-05-31 | Discharge: 2023-05-31 | Disposition: A | Discharge status: Home / Self Care | Payer: Medicaid Other | Attending: Emergency Medicine | Admitting: Emergency Medicine

## 2023-05-31 ENCOUNTER — Emergency Department: Payer: Medicaid Other

## 2023-05-31 DIAGNOSIS — R079 Chest pain, unspecified: Secondary | ICD-10-CM | POA: Diagnosis present

## 2023-05-31 LAB — URINALYSIS, ROUTINE W REFLEX MICROSCOPIC
Bilirubin Urine: NEGATIVE
Glucose, UA: NEGATIVE mg/dL
Hgb urine dipstick: NEGATIVE
Ketones, ur: NEGATIVE mg/dL
Leukocytes,Ua: NEGATIVE
Nitrite: NEGATIVE
Protein, ur: NEGATIVE mg/dL
Specific Gravity, Urine: 1.024 (ref 1.005–1.030)
pH: 5 (ref 5.0–8.0)

## 2023-05-31 LAB — BASIC METABOLIC PANEL
Anion gap: 11 (ref 5–15)
BUN: 16 mg/dL (ref 6–20)
CO2: 26 mmol/L (ref 22–32)
Calcium: 9.2 mg/dL (ref 8.9–10.3)
Chloride: 102 mmol/L (ref 98–111)
Creatinine, Ser: 0.71 mg/dL (ref 0.61–1.24)
GFR, Estimated: >60 mL/min (ref >60)
Glucose, Bld: 145 mg/dL — ABNORMAL HIGH (ref 70–99)
Potassium: 3.9 mmol/L (ref 3.5–5.1)
Sodium: 139 mmol/L (ref 135–145)

## 2023-05-31 LAB — CBC
HCT: 44.5 % (ref 39.0–52.0)
Hemoglobin: 14.8 g/dL (ref 13.0–17.0)
MCH: 29.2 pg (ref 26.0–34.0)
MCHC: 33.3 g/dL (ref 30.0–36.0)
MCV: 87.9 fL (ref 80.0–100.0)
Platelets: 347 10*3/uL (ref 150–400)
RBC: 5.06 MIL/uL (ref 4.22–5.81)
RDW: 13.1 % (ref 11.5–15.5)
WBC: 6.1 10*3/uL (ref 4.0–10.5)
nRBC: 0 % (ref 0.0–0.2)

## 2023-05-31 LAB — TROPONIN I (HIGH SENSITIVITY): Troponin I (High Sensitivity): 5 ng/L (ref <18)

## 2023-05-31 NOTE — Discharge Instructions (Signed)
Please seek medical attention for any high fevers, chest pain, shortness of breath, change in behavior, persistent vomiting, bloody stool or any other new or concerning symptoms.

## 2023-05-31 NOTE — ED Notes (Signed)
See triage notes. Patient c/o "feeling like his heart was deflating and not pumping right" last night and today and numbness in two of the fingers of the left hand. Family hx of heart disease

## 2023-05-31 NOTE — ED Provider Notes (Signed)
[facility] Provider Note    Event Date/Time   First MD Initiated Contact with Patient 05/31/23 1226     (approximate)   History   Chest Pain   HPI  Francis Sanchez is a 43 y.o. male who presents to the emergency department today because of feeling like his heart is deflating.  The patient states that it has happened in the past but seems to becoming more frequent.  This morning it was happening more often.  He has not noticed any pattern to it.  This was accompanied by sensation of numbness to his left fingers. No shortness of breath.      Physical Exam   Triage Vital Signs: ED Triage Vitals [05/31/23 0837]  Encounter Vitals Group     BP (!) 151/101     Systolic BP Percentile      Diastolic BP Percentile      Pulse Rate 87     Resp 18     Temp 98.4 F (36.9 C)     Temp Source Oral     SpO2 95 %     Weight      Height      Head Circumference      Peak Flow      Pain Score      Pain Loc      Pain Education      Exclude from Growth Chart     Most recent vital signs: Vitals:   05/31/23 0837  BP: (!) 151/101  Pulse: 87  Resp: 18  Temp: 98.4 F (36.9 C)  SpO2: 95%   General: Awake, alert, oriented. CV:  Good peripheral perfusion. Regular rate and rhythm. Resp:  Normal effort. Lungs clear. Abd:  No distention.    ED Results / Procedures / Treatments   Labs (all labs ordered are listed, but only abnormal results are displayed) Labs Reviewed  BASIC METABOLIC PANEL - Abnormal; Notable for the following components:      Result Value   Glucose, Bld 145 (*)    All other components within normal limits  URINALYSIS, ROUTINE W REFLEX MICROSCOPIC - Abnormal; Notable for the following components:   Color, Urine YELLOW (*)    APPearance CLEAR (*)    All other components within normal limits  CBC  TROPONIN I (HIGH SENSITIVITY)  TROPONIN I (HIGH SENSITIVITY)     EKG  I, Phineas Semen, attending physician, personally viewed and  interpreted this EKG  EKG Time: 0829 Rate: 75 Rhythm: normal sinus rhythm Axis: normal Intervals: qtc 381 QRS: narrow, q waves v1 ST changes: no st elevation Impression: abnormal ekg   RADIOLOGY I independently interpreted and visualized the CXR. My interpretation: No pneumonia Radiology interpretation:  IMPRESSION:  No acute cardiopulmonary findings.     PROCEDURES:  Critical Care performed: No    MEDICATIONS ORDERED IN ED: Medications - No data to display   IMPRESSION / MDM / ASSESSMENT AND PLAN / ED COURSE  I reviewed the triage vital signs and the nursing notes.                              Differential diagnosis includes, but is not limited to, ACS, pneumonia, pneumothorax  Patient's presentation is most consistent with acute presentation with potential threat to life or bodily function.   Patient presents to the emergency department today because of concerns for feeling like his heart is deflating.  EKG  without any concerning arrhythmia.  Troponin negative.  Patient also concern for possible blood clot however at this time I have low suspicion for pulmonary embolism.  I did offer to obtain D-dimer however patient felt comfortable deferring after discussion.  Do wonder if patient is experienced PVCs.  I discussed with patient portance of primary care follow-up.     FINAL CLINICAL IMPRESSION(S) / ED DIAGNOSES   Final diagnoses:  Nonspecific chest pain      Note:  This document was prepared using Dragon voice recognition software and may include unintentional dictation errors.    Phineas Semen, MD 05/31/23 [phone]

## 2023-05-31 NOTE — ED Triage Notes (Signed)
Patient states he was at work states he felt like his heart was deflating  and having chest tenderness and also felt like his fingers in his left hand went numb. Patient states the numbness is not as bad ad it was. 5/10 chest pain. Pain is intermittent. Patient states the pain started last night while at work. Patient states he was previously told he had an irregular P wave. Also reports urinary urgency.

## 2023-12-26 ENCOUNTER — Emergency Department

## 2023-12-26 ENCOUNTER — Emergency Department
Admission: EM | Admit: 2023-12-26 | Discharge: 2023-12-26 | Disposition: A | Discharge status: Home / Self Care | Attending: Emergency Medicine | Admitting: Emergency Medicine

## 2023-12-26 DIAGNOSIS — R1031 Right lower quadrant pain: Secondary | ICD-10-CM | POA: Insufficient documentation

## 2023-12-26 LAB — COMPREHENSIVE METABOLIC PANEL WITH GFR
ALT: 30 U/L (ref 0–44)
AST: 28 U/L (ref 15–41)
Albumin: 4.2 g/dL (ref 3.5–5.0)
Alkaline Phosphatase: 52 U/L (ref 38–126)
Anion gap: 10 (ref 5–15)
BUN: 19 mg/dL (ref 6–20)
CO2: 24 mmol/L (ref 22–32)
Calcium: 9.3 mg/dL (ref 8.9–10.3)
Chloride: 106 mmol/L (ref 98–111)
Creatinine, Ser: 0.89 mg/dL (ref 0.61–1.24)
GFR, Estimated: >60 mL/min (ref >60)
Glucose, Bld: 135 mg/dL — ABNORMAL HIGH (ref 70–99)
Potassium: 4.1 mmol/L (ref 3.5–5.1)
Sodium: 140 mmol/L (ref 135–145)
Total Bilirubin: 0.4 mg/dL (ref 0.0–1.2)
Total Protein: 7.6 g/dL (ref 6.5–8.1)

## 2023-12-26 LAB — CBC
HCT: 44.8 % (ref 39.0–52.0)
Hemoglobin: 14.7 g/dL (ref 13.0–17.0)
MCH: 29.4 pg (ref 26.0–34.0)
MCHC: 32.8 g/dL (ref 30.0–36.0)
MCV: 89.6 fL (ref 80.0–100.0)
Platelets: 374 K/uL (ref 150–400)
RBC: 5 MIL/uL (ref 4.22–5.81)
RDW: 13.2 % (ref 11.5–15.5)
WBC: 8.2 K/uL (ref 4.0–10.5)
nRBC: 0 % (ref 0.0–0.2)

## 2023-12-26 LAB — URINALYSIS, ROUTINE W REFLEX MICROSCOPIC
Bacteria, UA: NONE SEEN
Bilirubin Urine: NEGATIVE
Glucose, UA: NEGATIVE mg/dL
Ketones, ur: NEGATIVE mg/dL
Leukocytes,Ua: NEGATIVE
Nitrite: NEGATIVE
Protein, ur: NEGATIVE mg/dL
Specific Gravity, Urine: 1.019 (ref 1.005–1.030)
pH: 5 (ref 5.0–8.0)

## 2023-12-26 LAB — LIPASE, BLOOD: Lipase: 72 U/L — ABNORMAL HIGH (ref 11–51)

## 2023-12-26 MED ORDER — ONDANSETRON 4 MG PO TBDP: 4.0000 mg | ORAL_TABLET | Freq: Three times a day (TID) | ORAL | 0 refills | Status: AC | PRN

## 2023-12-26 MED ORDER — CEPHALEXIN 500 MG PO CAPS
500.0000 mg | ORAL_CAPSULE | Freq: Three times a day (TID) | ORAL | 0 refills | Status: AC
Start: 2023-12-26 — End: 2024-01-02

## 2023-12-26 MED ORDER — HYDROCODONE-ACETAMINOPHEN 5-325 MG PO TABS: 1.0000 | ORAL_TABLET | Freq: Three times a day (TID) | ORAL | 0 refills | Status: AC | PRN

## 2023-12-26 MED ORDER — IOHEXOL 300 MG/ML  SOLN: 100.0000 mL | Freq: Once | INTRAMUSCULAR | Status: DC | PRN

## 2023-12-26 MED ORDER — IOHEXOL 300 MG/ML  SOLN
100.0000 mL | Freq: Once | INTRAMUSCULAR | Status: AC | PRN
  Administered 2023-12-26: 100 mL via INTRAVENOUS

## 2023-12-26 NOTE — Discharge Instructions (Addendum)
 Your exam, labs, and CT scan are normal reassuring at this time.  No signs of any acute appendicitis or kidney infection.  No evidence of any kidney stones on exam.  UA does show some microscopic blood in urine acute pain raises concern for possible kidney stone passage.  Continue to rest and hydrate as appropriate.  Take the prescription pain medicines as needed.  Follow-up with your primary provider or return to the ED if needed.

## 2023-12-26 NOTE — ED Provider Notes (Signed)
 [facility] Emergency Department Provider Note     Event Date/Time   First MD Initiated Contact with Patient 12/26/23 1557     (approximate)   History   Abdominal Pain   HPI  Francis Sanchez is a 43 y.o. male with a history of GERD, esophagitis, dysphagia, presents to the ED via EMS from home.  Patient presents endorsing sudden onset of intense right lower quadrant abdominal pain that started about 20 minutes prior to call out.  He states the pain is tender to palpation and feels like a tearing pain.  He reports 10 out of 10 pain at onset.  At the time initial evaluation, after 75 mcg of fentanyl given IV via EMS, his pain is now 2 out of 10.  No reports of any nausea, vomiting, or bowel changes.  Patient denies any history of diverticulitis, kidney stones, testicular pain or swelling.   Physical Exam   Triage Vital Signs: ED Triage Vitals  Encounter Vitals Group     BP 12/26/23 1407 (!) 140/100     Girls Systolic BP Percentile --      Girls Diastolic BP Percentile --      Boys Systolic BP Percentile --      Boys Diastolic BP Percentile --      Pulse Rate 12/26/23 1407 75     Resp 12/26/23 1407 20     Temp 12/26/23 1407 99.4 F (37.4 C)     Temp src --      SpO2 12/26/23 1407 98 %     Weight --      Height --      Head Circumference --      Peak Flow --      Pain Score 12/26/23 1359 2     Pain Loc --      Pain Education --      Exclude from Growth Chart --     Most recent vital signs: Vitals:   12/26/23 1837 12/26/23 1838  BP: (!) 138/96   Pulse: 74   Resp: 18   Temp:  97.8 F (36.6 C)  SpO2: 98%     General Awake, no distress. NAD HEENT NCAT. PERRL. EOMI. No rhinorrhea. Mucous membranes are moist.  CV:  Good peripheral perfusion. RRR RESP:  Normal effort. CTA ABD:  No distention.  Soft and mildly tender to palpation to the right lower quadrant and suprapubic region.  No CVA tenderness elicited.   ED Results / Procedures /  Treatments   Labs (all labs ordered are listed, but only abnormal results are displayed) Labs Reviewed  LIPASE, BLOOD - Abnormal; Notable for the following components:      Result Value   Lipase 72 (*)    All other components within normal limits  COMPREHENSIVE METABOLIC PANEL WITH GFR - Abnormal; Notable for the following components:   Glucose, Bld 135 (*)    All other components within normal limits  URINALYSIS, ROUTINE W REFLEX MICROSCOPIC - Abnormal; Notable for the following components:   Color, Urine YELLOW (*)    APPearance CLEAR (*)    Hgb urine dipstick LARGE (*)    All other components within normal limits  URINE CULTURE  CBC     EKG   RADIOLOGY  I personally viewed and evaluated these images as part of my medical decision making, as well as reviewing the written report by the radiologist.  ED Provider Interpretation: No acute findings  CT ABDOMEN PELVIS  W CONTRAST Result Date: 12/26/2023 CLINICAL DATA:  Right lower quadrant abdominal pain. Pain started about 20 minutes ago. EXAM: CT ABDOMEN AND PELVIS WITH CONTRAST TECHNIQUE: Multidetector CT imaging of the abdomen and pelvis was performed using the standard protocol following bolus administration of intravenous contrast. RADIATION DOSE REDUCTION: This exam was performed according to the departmental dose-optimization program which includes automated exposure control, adjustment of the mA and/or kV according to patient size and/or use of iterative reconstruction technique. CONTRAST:  OMNIPAQUE  IOHEXOL  300 MG/ML  SOLN COMPARISON:  Ultrasound right upper quadrant 09/25/2019. CT pelvis 10/15/2020. CT abdomen and pelvis 07/06/2012 FINDINGS: Lower chest: Lung bases are clear. Hepatobiliary: Diffuse fatty infiltration of the liver. No focal lesions. Gallbladder and bile ducts are normal. Pancreas: Unremarkable. No pancreatic ductal dilatation or surrounding inflammatory changes. Spleen: Normal in size without focal  abnormality. Adrenals/Urinary Tract: Adrenal glands are unremarkable. Kidneys are normal, without renal calculi, focal lesion, or hydronephrosis. Bladder is unremarkable. Stomach/Bowel: Stomach is within normal limits. Appendix appears normal. No evidence of bowel wall thickening, distention, or inflammatory changes. Duodenal diverticulum. Vascular/Lymphatic: No significant vascular findings are present. No enlarged abdominal or pelvic lymph nodes. Reproductive: Prostate is unremarkable. Other: No free air or free fluid in the abdomen. Small periumbilical hernias containing fat. Moderate left inguinal hernia containing fat. Postoperative changes consistent with right hernia repair. Musculoskeletal: No acute or significant osseous findings. IMPRESSION: 1. Diffuse fatty infiltration of the liver. 2. Duodenal diverticulum. 3. Small periumbilical hernias containing fat. Moderate left inguinal hernia containing fat. No bowel herniation or obstruction. 4. Appendix is normal. Electronically Signed   By: Elsie Gravely M.D.   On: 12/26/2023 18:07     PROCEDURES:  Critical Care performed: No  Procedures   MEDICATIONS ORDERED IN ED: Medications  iohexol  (OMNIPAQUE ) 300 MG/ML solution 100 mL (100 mLs Intravenous Contrast Given 12/26/23 1759)     IMPRESSION / MDM / ASSESSMENT AND PLAN / ED COURSE  I reviewed the triage vital signs and the nursing notes.                              Differential diagnosis includes, but is not limited to, acute appendicitis, renal colic, testicular torsion, urinary tract infection/pyelonephritis, prostatitis,  epididymitis, diverticulitis, small bowel obstruction or ileus, colitis, abdominal aortic aneurysm, gastroenteritis, hernia, etc.  Patient's presentation is most consistent with acute complicated illness / injury requiring diagnostic workup.  Patient's diagnosis is consistent with acute, intense right lower quadrant pain.  Clinical presentation highly concerning  for possible kidney stone passage.  Patient presents via EMS from home, with sudden onset of right lower quadrant pain.  Exam is reassuring.  Vital signs overall without any evidence of tachycardia, fever, or tachypnea.  Labs without evidence of leukocytosis or critical anemia.  UA does show some hemoglobinuria which raises concern for possible passed kidney stone.  Patient with completely resolved symptoms at the time of my evaluation.  No CT evidence of any nephrolithiasis hydronephrosis, or cystitis.  Patient will be discharged home with prescriptions for Keflex , hydrocodone  (Emina) and Zofran .  Urine culture is pending at this time.  Patient will follow culture results, and dose the antibiotic if advised to do so.  Patient is to follow up with PCP or urology as needed or otherwise directed. Patient is given ED precautions to return to the ED for any worsening or new symptoms.   FINAL CLINICAL IMPRESSION(S) / ED DIAGNOSES   Final diagnoses:  Right lower quadrant abdominal pain     Rx / DC Orders   ED Discharge Orders          Ordered    cephALEXin  (KEFLEX ) 500 MG capsule  3 times daily        12/26/23 1845    HYDROcodone -acetaminophen  (NORCO/VICODIN) 5-325 MG tablet  3 times daily PRN        12/26/23 1845    ondansetron  (ZOFRAN -ODT) 4 MG disintegrating tablet  Every 8 hours PRN        12/26/23 1845             Note:  This document was prepared using Dragon voice recognition software and may include unintentional dictation errors.    Loyd Candida LULLA Aldona, PA-C 12/26/23 2340    Arlander Charleston, MD 12/28/23 1101

## 2023-12-26 NOTE — ED Triage Notes (Signed)
 Pt comes via EMS from home with c/o belly pain on the lower right side that started about 20 mins ago. Pt states tender to touch. Pt felt like tearing pain. Pt states 10/10 pain. Pt given 75 mcg fent and now at 2/10 pain.  Pt denies any N/V CBG 110 20 g in right AC  165/96 75 HR

## 2023-12-28 LAB — URINE CULTURE
Culture: NO GROWTH
Special Requests: NORMAL
